# Patient Record
Sex: Male | Born: 1960 | Race: Black or African American | Hispanic: No | Marital: Single | Smoking: Current every day smoker
Health system: Southern US, Community
[De-identification: ages and names within clinical notes are randomized; demographics above are authoritative.]

## PROBLEM LIST (undated history)

## (undated) HISTORY — PX: ELBOW SURGERY: SHX618

## (undated) HISTORY — PX: OTHER SURGICAL HISTORY: SHX169

---

## 1998-07-26 ENCOUNTER — Emergency Department (HOSPITAL_COMMUNITY): Admission: EM | Admit: 1998-07-26 | Discharge: 1998-07-26 | Payer: Self-pay | Admitting: Emergency Medicine

## 1998-10-15 ENCOUNTER — Inpatient Hospital Stay (HOSPITAL_COMMUNITY): Admission: EM | Admit: 1998-10-15 | Discharge: 1998-10-20 | Payer: Self-pay | Admitting: Emergency Medicine

## 1998-10-15 ENCOUNTER — Encounter: Payer: Self-pay | Admitting: Emergency Medicine

## 1998-10-16 ENCOUNTER — Encounter: Payer: Self-pay | Admitting: Orthopedic Surgery

## 2004-05-03 ENCOUNTER — Emergency Department (HOSPITAL_COMMUNITY): Admission: EM | Admit: 2004-05-03 | Discharge: 2004-05-03 | Payer: Self-pay | Admitting: Emergency Medicine

## 2004-07-07 ENCOUNTER — Emergency Department (HOSPITAL_COMMUNITY): Admission: EM | Admit: 2004-07-07 | Discharge: 2004-07-07 | Payer: Self-pay | Admitting: Family Medicine

## 2006-10-27 ENCOUNTER — Emergency Department (HOSPITAL_COMMUNITY): Admission: EM | Admit: 2006-10-27 | Discharge: 2006-10-27 | Payer: Self-pay | Admitting: Family Medicine

## 2007-03-14 ENCOUNTER — Emergency Department (HOSPITAL_COMMUNITY): Admission: EM | Admit: 2007-03-14 | Discharge: 2007-03-14 | Payer: Self-pay | Admitting: Emergency Medicine

## 2007-07-30 ENCOUNTER — Emergency Department (HOSPITAL_COMMUNITY): Admission: EM | Admit: 2007-07-30 | Discharge: 2007-07-30 | Payer: Self-pay | Admitting: Emergency Medicine

## 2008-02-10 ENCOUNTER — Emergency Department (HOSPITAL_COMMUNITY): Admission: EM | Admit: 2008-02-10 | Discharge: 2008-02-10 | Payer: Self-pay | Admitting: Emergency Medicine

## 2008-03-04 ENCOUNTER — Emergency Department (HOSPITAL_COMMUNITY): Admission: EM | Admit: 2008-03-04 | Discharge: 2008-03-04 | Payer: Self-pay | Admitting: Emergency Medicine

## 2011-06-05 LAB — POCT URINALYSIS DIP (DEVICE)
Glucose, UA: NEGATIVE
Nitrite: NEGATIVE
Operator id: 235561
Specific Gravity, Urine: 1.02
Urobilinogen, UA: 1

## 2012-01-08 ENCOUNTER — Encounter (HOSPITAL_COMMUNITY): Payer: Self-pay | Admitting: Emergency Medicine

## 2012-01-08 ENCOUNTER — Emergency Department (HOSPITAL_COMMUNITY)
Admission: EM | Admit: 2012-01-08 | Discharge: 2012-01-08 | Disposition: A | Discharge status: Home / Self Care | Payer: Self-pay | Attending: Emergency Medicine | Admitting: Emergency Medicine

## 2012-01-08 ENCOUNTER — Emergency Department (HOSPITAL_COMMUNITY): Payer: Self-pay

## 2012-01-08 DIAGNOSIS — M25529 Pain in unspecified elbow: Secondary | ICD-10-CM | POA: Insufficient documentation

## 2012-01-08 DIAGNOSIS — M25522 Pain in left elbow: Secondary | ICD-10-CM

## 2012-01-08 MED ORDER — IBUPROFEN 200 MG PO TABS
400.0000 mg | ORAL_TABLET | Freq: Once | ORAL | Status: AC
  Administered 2012-01-08: 400 mg via ORAL
  Filled 2012-01-08: qty 2

## 2012-01-08 MED ORDER — OXYCODONE-ACETAMINOPHEN 5-325 MG PO TABS
2.0000 | ORAL_TABLET | Freq: Once | ORAL | Status: AC
  Administered 2012-01-08: 2 via ORAL
  Filled 2012-01-08: qty 2

## 2012-01-08 MED ORDER — OXYCODONE-ACETAMINOPHEN 5-325 MG PO TABS: 1.0000 | ORAL_TABLET | ORAL | Status: AC | PRN

## 2012-01-08 MED ORDER — NAPROXEN 500 MG PO TABS: 500.0000 mg | ORAL_TABLET | Freq: Two times a day (BID) | ORAL | Status: AC | PRN

## 2012-01-08 NOTE — Discharge Instructions (Signed)
 RESOURCE GUIDE  Dental Problems  Patients with Medicaid: [facility]                     [facility] [address]                                           [address] Phone:  [phone]                                                  Phone:  [phone]  If unable to pay or uninsured, contact:  [facility] or [facility] to become qualified for the adult dental clinic.  Chronic Pain Problems Contact [facility]  [phone] Patients need to be referred by their primary care doctor.  Insufficient Money for Medicine Contact [employer]:  call "211" or [facility] 271-5999.  No Primary Care Doctor Call [facility]  832-8000 Other agencies that provide inexpensive medical care    [facility]  832-8035    [facility]  [phone]    [facility]  [phone]    [facility]  832-4777    [facility]  [phone]    [facility]  [phone]  Psychological Services [facility]  [phone] [facility]  [phone] [facility]   800 853-5163 (emergency services 641-4993)  Substance Abuse Resources [facility]  336-882-2125 [facility] 336-784-9470 [facility] 336-285-9073 [facility] 336-845-3988 Residential & Outpatient Substance Abuse Program  800-659-3381  Abuse/Neglect [place] Child Abuse Hotline (336) 641-3795 [place] Child Abuse Hotline 800-378-5315 (After Hours)  Emergency Shelter [facility] (336) 271-5985  [facility] (336) 275-9566 [facility] (704) 372-4663  MRSA Hotline #:   832-7006    [place] [facility]  [facility] [place]     [employer]                          [facility] [address] [place]                       [address]      [address]  [place]                                                [place]                            [place] Phone:  [phone]                                   Phone:  [phone]                 Phone:  [phone]  [facility] Phone:    [phone]  [place] Child Abuse Hotline (336) 342-1394 (336) 342-3537 (After Hours)   

## 2012-01-08 NOTE — ED Provider Notes (Signed)
History  This chart was scribed for Raeford Razor, MD by Bennett Scrape. This patient was seen in room STRE5/STRE5 and the patient's care was started at 1:59PM.  CSN: [id number]  Arrival date & time 01/08/12  1121   First MD Initiated Contact with Patient 01/08/12 1359      Chief Complaint  Patient presents with  . Arm Pain     The history is provided by the patient. No language interpreter was used.    Jose Taylor is a 51 y.o. male who presents to the Emergency Department complaining of two weeks of gradual onset, gradually worsening, intermittent left elbow pain described as a sharp ache that he noticed while stretching.  He rates his pain an 8 out of 10 currently. He states that he is unable to pick up anything heavy. He reports that he took a tylenol for arthritis pill 2 days ago with no improvement in his symptoms. Pt reports that he has started exercise again recently as well. He reports that he broke his left elbow 2 years ago and states that he removed his cast prematurely. He is afraid that this cause some type of chronic injury. He denies numbness or tingling as associated symptoms. He has no h/o chronic medical conditions. He is an alcohol user but denies smoking.  History reviewed. No pertinent past medical history.  Past Surgical History  Procedure Date  . Elbow surgery     No family history on file.  History  Substance Use Topics  . Smoking status: Never Smoker   . Smokeless tobacco: Not on file  . Alcohol Use: Yes      Review of Systems  Constitutional: Negative for fever and chills.  Respiratory: Negative for cough and shortness of breath.   Gastrointestinal: Negative for nausea and vomiting.  Musculoskeletal: Negative for back pain.       Left elbow pain  Neurological: Negative for weakness and headaches.  All other systems reviewed and are negative.    Allergies  Penicillins  Home Medications  No current outpatient prescriptions on  file.  Triage Vitals: BP 99/67  Pulse 84  Temp(Src) 97.1 F (36.2 C) (Oral)  Resp 20  SpO2 99%  Physical Exam  Nursing note and vitals reviewed. Constitutional: He is oriented to person, place, and time. He appears well-developed and well-nourished. No distress.  HENT:  Head: Normocephalic and atraumatic.  Eyes: EOM are normal.  Neck: Neck supple. No tracheal deviation present.  Cardiovascular: Normal rate.   Pulmonary/Chest: Effort normal. No respiratory distress.  Musculoskeletal: Normal range of motion.  Neurological: He is alert and oriented to person, place, and time.  Skin: Skin is warm and dry.  Psychiatric: He has a normal mood and affect. His behavior is normal.    ED Course  Procedures (including critical care time)  DIAGNOSTIC STUDIES: Oxygen Saturation is 99% on room air, normal by my interpretation.    COORDINATION OF CARE: 2:12PM-Discussed x-rays and pain medication as treatment plan with pt and pt agreed to plan. 3:09PM-Informed pt of negative x-ray and discussed discharge plan which pt agreed to.  Labs Reviewed - No data to display Dg Elbow Complete Left  01/08/2012  *RADIOLOGY REPORT*  Clinical Data: The left elbow pain.  LEFT ELBOW - COMPLETE 3+ VIEW  Comparison: None.  Findings: No acute bony abnormality.  Specifically, no fracture, subluxation, or dislocation.  Soft tissues are intact.  No joint effusion.  IMPRESSION: No acute bony abnormality.  Original Report Authenticated By: Aubery Lapping  DOVER, M.D.     1. Elbow pain, left       MDM  51yM with L elbow pain. Remote hx of trauma but nothing acute. XR normal. Exam unremarkable. No tenderness over medial/lateral condyles. Possible strain from recently restarting to work out. Plan symptomatic tx at this time.    I personally preformed the services scribed in my presence. The recorded information has been reviewed and considered. Raeford Razor, MD.    Raeford Razor, MD 01/10/12 [phone]

## 2012-01-08 NOTE — ED Notes (Signed)
Onset two weeks ago left elbow pain intermittent recent constant states history of broke left elbow.  Patient has full range of motion pain 8/10 achy sharp states unable to pick up anything heavy.

## 2012-01-08 NOTE — ED Notes (Signed)
Pt received to RM 5 with c/o on and off lt elbow x 2-3 weeks. Pt denies any trauma. Pt is NAD

## 2013-02-21 ENCOUNTER — Encounter (HOSPITAL_COMMUNITY): Payer: Self-pay | Admitting: Emergency Medicine

## 2013-02-21 ENCOUNTER — Emergency Department (HOSPITAL_COMMUNITY)
Admission: EM | Admit: 2013-02-21 | Discharge: 2013-02-21 | Disposition: A | Discharge status: Home / Self Care | Payer: Self-pay | Attending: Emergency Medicine | Admitting: Emergency Medicine

## 2013-02-21 DIAGNOSIS — R300 Dysuria: Secondary | ICD-10-CM | POA: Insufficient documentation

## 2013-02-21 DIAGNOSIS — Z880 Allergy status to penicillin: Secondary | ICD-10-CM | POA: Insufficient documentation

## 2013-02-21 LAB — URINALYSIS, ROUTINE W REFLEX MICROSCOPIC
Leukocytes, UA: NEGATIVE
Nitrite: NEGATIVE
Protein, ur: NEGATIVE mg/dL
Urobilinogen, UA: 1 mg/dL (ref 0.0–1.0)

## 2013-02-21 NOTE — ED Provider Notes (Signed)
   History    CSN: [id number] Arrival date & time 02/21/13  4098  First MD Initiated Contact with Patient 02/21/13 [phone]     No chief complaint on file.  (Consider location/radiation/quality/duration/timing/severity/associated sxs/prior Treatment) HPI Jose Taylor is a 52 y/o male who presents with c/o tingling in his urethra. He states he is worried about an STD. The patient had unprotected oral sex approximately 2 weeks ago. He denies anal or vaginal intercourse. He states that since then he has had tingling in his penis the is intemittent, and not associated with urination. He dienis discharge, lesions, testicle pain, groin swelling. Patient denis symptoms of prostatic hypertrophy. Denies  Dysuria, hematuria, abdominal pain, flank pain, nausea,or vomiting. He denies fever, chills, Myalgias, arthralgias, or rash.  History reviewed. No pertinent past medical history. Past Surgical History  Procedure Laterality Date  . Elbow surgery    . Artificial hip     History reviewed. No pertinent family history. History  Substance Use Topics  . Smoking status: Never Smoker   . Smokeless tobacco: Not on file  . Alcohol Use: Yes    Review of Systems As stated in HPI Allergies  Penicillins  Home Medications  No current outpatient prescriptions on file. BP 113/75  Pulse 70  Temp(Src) 97.3 F (36.3 C) (Oral)  Resp 16  SpO2 100% Physical Exam Physical Exam  Nursing note and vitals reviewed. Constitutional: He appears well-developed and well-nourished. No distress.  HENT:  Head: Normocephalic and atraumatic.  Eyes: Conjunctivae normal are normal. No scleral icterus.  Neck: Normal range of motion. Neck supple.  Cardiovascular: Normal rate, regular rhythm and normal heart sounds.   Pulmonary/Chest: Effort normal and breath sounds normal. No respiratory distress.  Abdominal: Soft. There is no tenderness.  Musculoskeletal: He exhibits no edema.  Neurological: He is alert.   Skin: Skin is warm and dry. He is not diaphoretic.  Psychiatric: His behavior is normal.  Penile exam: normal without lesions or discharge, circumcised, No testicular pain or masses, no hernias, cremasteric reflex present.  ED Course  Procedures (including critical care time) Labs Reviewed - No data to display No results found. 1. Dysuria     MDM  7:30 AM BP 113/75  Pulse 70  Temp(Src) 97.3 F (36.3 C) (Oral)  Resp 16  SpO2 100%. Patient with tingling in urethra. No discharge or abnormalities on PE. Will collect urine and screen for G/C-chlamydia with urine cytology.  8:33 AM Urine negative. G/C chlamydia pending. Patient informed that he will be contacted. F/U with Health department for further testing. The patient appears reasonably screened and/or stabilized for discharge and I doubt any other medical condition or other [facility] requiring further screening, evaluation, or treatment in the ED at this time prior to discharge.   Arthor Captain, PA-C 02/21/13 [phone]

## 2013-02-21 NOTE — ED Notes (Addendum)
Pt states he might have an STD due to receiving oral sex about 2 weeks ago. Pt states he has been having this tingling sensation. No pain/discharge. Pt alert ant oriented.

## 2013-02-22 LAB — GC/CHLAMYDIA PROBE AMP: CT Probe RNA: NEGATIVE

## 2013-02-22 NOTE — ED Provider Notes (Signed)
Medical screening examination/treatment/procedure(s) were performed by non-physician practitioner and as supervising physician I was immediately available for consultation/collaboration.   Joya Gaskins, MD 02/22/13 1525

## 2013-02-25 ENCOUNTER — Telehealth (HOSPITAL_COMMUNITY): Payer: Self-pay | Admitting: Emergency Medicine

## 2013-02-25 NOTE — ED Notes (Signed)
Pt calling for STD results.  ID verified x 2.  Pt informed gonorrhea and chlamydia were both negative.

## 2015-12-25 ENCOUNTER — Emergency Department (HOSPITAL_COMMUNITY)
Admission: EM | Admit: 2015-12-25 | Discharge: 2015-12-26 | Disposition: A | Discharge status: Home / Self Care | Payer: Self-pay | Attending: Emergency Medicine | Admitting: Emergency Medicine

## 2015-12-25 DIAGNOSIS — S51812A Laceration without foreign body of left forearm, initial encounter: Secondary | ICD-10-CM | POA: Insufficient documentation

## 2015-12-25 DIAGNOSIS — Y998 Other external cause status: Secondary | ICD-10-CM | POA: Insufficient documentation

## 2015-12-25 DIAGNOSIS — F1010 Alcohol abuse, uncomplicated: Secondary | ICD-10-CM | POA: Insufficient documentation

## 2015-12-25 DIAGNOSIS — Y9289 Other specified places as the place of occurrence of the external cause: Secondary | ICD-10-CM | POA: Insufficient documentation

## 2015-12-25 DIAGNOSIS — Y9389 Activity, other specified: Secondary | ICD-10-CM | POA: Insufficient documentation

## 2015-12-25 DIAGNOSIS — Z23 Encounter for immunization: Secondary | ICD-10-CM | POA: Insufficient documentation

## 2015-12-25 DIAGNOSIS — W25XXXA Contact with sharp glass, initial encounter: Secondary | ICD-10-CM | POA: Insufficient documentation

## 2015-12-25 MED ORDER — LORAZEPAM 2 MG/ML IJ SOLN
1.0000 mg | Freq: Once | INTRAMUSCULAR | Status: AC
  Administered 2015-12-25: 1 mg via INTRAVENOUS

## 2015-12-25 MED ORDER — HYDROMORPHONE HCL 1 MG/ML IJ SOLN
1.0000 mg | Freq: Once | INTRAMUSCULAR | Status: AC
  Administered 2015-12-25: 1 mg via INTRAVENOUS

## 2015-12-25 MED ORDER — TETANUS-DIPHTH-ACELL PERTUSSIS 5-2.5-18.5 LF-MCG/0.5 IM SUSP
0.5000 mL | Freq: Once | INTRAMUSCULAR | Status: AC
  Administered 2015-12-25: 0.5 mL via INTRAMUSCULAR
  Filled 2015-12-25: qty 0.5

## 2015-12-25 MED ORDER — SODIUM CHLORIDE 0.9 % IV BOLUS (SEPSIS)
1000.0000 mL | Freq: Once | INTRAVENOUS | Status: AC
  Administered 2015-12-25: 1000 mL via INTRAVENOUS

## 2015-12-25 MED ORDER — LORAZEPAM 2 MG/ML IJ SOLN
INTRAMUSCULAR | Status: AC
  Filled 2015-12-25: qty 1
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
## 2015-12-25 NOTE — ED Notes (Signed)
Airway is patent. RN and MD at bedside

## 2015-12-26 ENCOUNTER — Encounter (HOSPITAL_COMMUNITY): Payer: Self-pay

## 2015-12-26 ENCOUNTER — Emergency Department (HOSPITAL_COMMUNITY): Payer: Self-pay

## 2015-12-26 LAB — BASIC METABOLIC PANEL
Anion gap: 15 (ref 5–15)
BUN: 12 mg/dL (ref 6–20)
CO2: 20 mmol/L — ABNORMAL LOW (ref 22–32)
CREATININE: 1.06 mg/dL (ref 0.61–1.24)
Calcium: 9.2 mg/dL (ref 8.9–10.3)
Chloride: 107 mmol/L (ref 101–111)
GFR calc Af Amer: >60 mL/min (ref >60)
Glucose, Bld: 81 mg/dL (ref 65–99)
Potassium: 4.3 mmol/L (ref 3.5–5.1)
SODIUM: 142 mmol/L (ref 135–145)

## 2015-12-26 LAB — CBC WITH DIFFERENTIAL/PLATELET
Basophils Absolute: 0 10*3/uL (ref 0.0–0.1)
Basophils Relative: 0 %
EOS PCT: 3 %
Eosinophils Absolute: 0.4 10*3/uL (ref 0.0–0.7)
HCT: 38.8 % — ABNORMAL LOW (ref 39.0–52.0)
Hemoglobin: 12.6 g/dL — ABNORMAL LOW (ref 13.0–17.0)
LYMPHS ABS: 3.2 10*3/uL (ref 0.7–4.0)
Lymphocytes Relative: 24 %
MCH: 29.4 pg (ref 26.0–34.0)
MCHC: 32.5 g/dL (ref 30.0–36.0)
MCV: 90.4 fL (ref 78.0–100.0)
MONOS PCT: 6 %
Monocytes Absolute: 0.8 10*3/uL (ref 0.1–1.0)
NEUTROS ABS: 8.9 10*3/uL — AB (ref 1.7–7.7)
Neutrophils Relative %: 67 %
PLATELETS: 215 10*3/uL (ref 150–400)
RBC: 4.29 MIL/uL (ref 4.22–5.81)
RDW: 13.3 % (ref 11.5–15.5)
WBC: 13.3 10*3/uL — ABNORMAL HIGH (ref 4.0–10.5)

## 2015-12-26 MED ORDER — LIDOCAINE-EPINEPHRINE (PF) 2 %-1:200000 IJ SOLN
10.0000 mL | Freq: Once | INTRAMUSCULAR | Status: DC
  Filled 2015-12-26: qty 20

## 2015-12-26 NOTE — ED Notes (Signed)
Ortho was called and asked to come to ED to apply volar splint to patients left wrist.

## 2015-12-26 NOTE — ED Notes (Signed)
Pt verbalized understanding of d/c instructions and has no further questions. Pt stable and NAD. Pt instructed to get stitches removed in 10 days. Pt alert and oriented x 4.

## 2015-12-26 NOTE — Discharge Instructions (Signed)
PLEASE SEE DR Orlan LeavensTMAN THIS WEEK FOR RECHECK OF YOUR WOUND YOU WILL NEED STITCHES OUT IN 10 DAYS   Laceration Care, Adult A laceration is a cut that goes through all layers of the skin. The cut also goes into the tissue that is right under the skin. Some cuts heal on their own. Others need to be closed with stitches (sutures), staples, skin adhesive strips, or wound glue. Taking care of your cut lowers your risk of infection and helps your cut to heal better. HOW TO TAKE CARE OF YOUR CUT For stitches or staples:  Keep the wound clean and dry.  If you were given a bandage (dressing), you should change it at least one time per day or as told by your doctor. You should also change it if it gets wet or dirty.  Keep the wound completely dry for the first 24 hours or as told by your doctor. After that time, you may take a shower or a bath. However, make sure that the wound is not soaked in water until after the stitches or staples have been removed.  Clean the wound one time each day or as told by your doctor:  Wash the wound with soap and water.  Rinse the wound with water until all of the soap comes off.  Pat the wound dry with a clean towel. Do not rub the wound.  After you clean the wound, put a thin layer of antibiotic ointment on it as told by your doctor. This ointment:  Helps to prevent infection.  Keeps the bandage from sticking to the wound.  Have your stitches or staples removed as told by your doctor. If your doctor used skin adhesive strips:   Keep the wound clean and dry.  If you were given a bandage, you should change it at least one time per day or as told by your doctor. You should also change it if it gets dirty or wet.  Do not get the skin adhesive strips wet. You can take a shower or a bath, but be careful to keep the wound dry.  If the wound gets wet, pat it dry with a clean towel. Do not rub the wound.  Skin adhesive strips fall off on their own. You can trim the  strips as the wound heals. Do not remove any strips that are still stuck to the wound. They will fall off after a while. If your doctor used wound glue:  Try to keep your wound dry, but you may briefly wet it in the shower or bath. Do not soak the wound in water, such as by swimming.  After you take a shower or a bath, gently pat the wound dry with a clean towel. Do not rub the wound.  Do not do any activities that will make you really sweaty until the skin glue has fallen off on its own.  Do not apply liquid, cream, or ointment medicine to your wound while the skin glue is still on.  If you were given a bandage, you should change it at least one time per day or as told by your doctor. You should also change it if it gets dirty or wet.  If a bandage is placed over the wound, do not let the tape for the bandage touch the skin glue.  Do not pick at the glue. The skin glue usually stays on for 5-10 days. Then, it falls off of the skin. General Instructions  To help prevent scarring,  make sure to cover your wound with sunscreen whenever you are outside after stitches are removed, after adhesive strips are removed, or when wound glue stays in place and the wound is healed. Make sure to wear a sunscreen of at least 30 SPF.  Take over-the-counter and prescription medicines only as told by your doctor.  If you were given antibiotic medicine or ointment, take or apply it as told by your doctor. Do not stop using the antibiotic even if your wound is getting better.  Do not scratch or pick at the wound.  Keep all follow-up visits as told by your doctor. This is important.  Check your wound every day for signs of infection. Watch for:  Redness, swelling, or pain.  Fluid, blood, or pus.  Raise (elevate) the injured area above the level of your heart while you are sitting or lying down, if possible. GET HELP IF:  You got a tetanus shot and you have any of these problems at the injection  site:  Swelling.  Very bad pain.  Redness.  Bleeding.  You have a fever.  A wound that was closed breaks open.  You notice a bad smell coming from your wound or your bandage.  You notice something coming out of the wound, such as wood or glass.  Medicine does not help your pain.  You have more redness, swelling, or pain at the site of your wound.  You have fluid, blood, or pus coming from your wound.  You notice a change in the color of your skin near your wound.  You need to change the bandage often because fluid, blood, or pus is coming from the wound.  You start to have a new rash.  You start to have numbness around the wound. GET HELP RIGHT AWAY IF:  You have very bad swelling around the wound.  Your pain suddenly gets worse and is very bad.  You notice painful lumps near the wound or on skin that is anywhere on your body.  You have a red streak going away from your wound.  The wound is on your hand or foot and you cannot move a finger or toe like you usually can.  The wound is on your hand or foot and you notice that your fingers or toes look pale or bluish.   This information is not intended to replace advice given to you by your health care provider. Make sure you discuss any questions you have with your health care provider.   Document Released: 01/31/2008 Document Revised: 12/29/2014 Document Reviewed: 08/10/2014 Elsevier Interactive Patient Education [employer]

## 2015-12-27 ENCOUNTER — Encounter (HOSPITAL_COMMUNITY): Payer: Self-pay | Admitting: Emergency Medicine

## 2016-01-08 ENCOUNTER — Encounter (HOSPITAL_COMMUNITY): Payer: Self-pay | Admitting: Emergency Medicine

## 2016-01-08 ENCOUNTER — Emergency Department (HOSPITAL_COMMUNITY)
Admission: EM | Admit: 2016-01-08 | Discharge: 2016-01-08 | Disposition: A | Discharge status: Home / Self Care | Payer: Self-pay | Attending: Emergency Medicine | Admitting: Emergency Medicine

## 2016-01-08 DIAGNOSIS — Z880 Allergy status to penicillin: Secondary | ICD-10-CM | POA: Insufficient documentation

## 2016-01-08 DIAGNOSIS — Z4802 Encounter for removal of sutures: Secondary | ICD-10-CM | POA: Insufficient documentation

## 2016-01-08 DIAGNOSIS — Z9889 Other specified postprocedural states: Secondary | ICD-10-CM | POA: Insufficient documentation

## 2016-01-08 DIAGNOSIS — F17210 Nicotine dependence, cigarettes, uncomplicated: Secondary | ICD-10-CM | POA: Insufficient documentation

## 2016-01-08 NOTE — ED Provider Notes (Signed)
CSN: [id number]     Arrival date & time 01/08/16  7829 History  By signing my name below, I, Tanda Rockers, attest that this documentation has been prepared under the direction and in the presence of Everlene Farrier, PA-C. Electronically Signed: Tanda Rockers, ED Scribe. 01/08/2016. 9:51 AM.   Chief Complaint  Patient presents with  . Suture / Staple Removal   The history is provided by the patient. No language interpreter was used.    HPI Comments: Jose Taylor is a 55 y.o. male who presents to the Emergency Department for suture removal. Pt was seen in the ED on 12/25/2015 (approximately 2 weeks ago) for a laceration to the medial left wrist. He had 5 sutures and 3 staples placed as well as a splint. X-ray showed no fracture. Pt was told to follow up with a hand specialist for possible partial tendon injury that was not identified at time of being seen. Pt states that he did not follow up with hand and was stuck in [place], [place] this week so he was unable to come to the ED to have his sutures removed at an earlier time. He mentions that his splint caught onto a door knob yesterday, causing some irritation to the area. He had not removed the splint until last night. Denies weakness, numbness, tingling, drainage, fever, chills, or any other associated symptoms.   History reviewed. No pertinent past medical history. Past Surgical History  Procedure Laterality Date  . Elbow surgery    . Artificial hip     No family history on file. Social History  Substance Use Topics  . Smoking status: Current Every Day Smoker -- 0.20 packs/day    Types: Cigarettes  . Smokeless tobacco: None  . Alcohol Use: Yes     Comment: 3 beers per day    Review of Systems  Constitutional: Negative for fever and chills.  Skin:       Negative for drainage  Neurological: Negative for weakness and numbness.   Allergies  Penicillins and Penicillins  Home Medications   Prior to Admission medications    Not on File   BP 137/89 mmHg  Pulse 108  Temp(Src) 98.2 F (36.8 C)  Resp 20  SpO2 98%   Physical Exam  Constitutional: He appears well-developed and well-nourished. No distress.  Nontoxic appearing.  HENT:  Head: Normocephalic and atraumatic.  Eyes: Right eye exhibits no discharge. Left eye exhibits no discharge.  Cardiovascular: Normal rate, regular rhythm and intact distal pulses.   Bilateral radial pulses are intact. Good capillary refill to his left distal fingertips.  Pulmonary/Chest: Effort normal. No respiratory distress.  Musculoskeletal: He exhibits no edema or tenderness.  No discharge, erythema, edema, or tenderness around wound. Good strength to left hand at each digit.  Sutures and staples in place. No evidence of infection.   Neurological: He is alert. Coordination normal.  Skin: Skin is warm and dry. No rash noted. He is not diaphoretic. No erythema. No pallor.  Psychiatric: He has a normal mood and affect. His behavior is normal.  Nursing note and vitals reviewed.   ED Course  .Suture Removal Date/Time: 01/08/2016 9:55 AM Performed by: Everlene Farrier Authorized by: Everlene Farrier Consent: Verbal consent obtained. Risks and benefits: risks, benefits and alternatives were discussed Consent given by: patient Patient understanding: patient states understanding of the procedure being performed Patient consent: the patient's understanding of the procedure matches consent given Procedure consent: procedure consent matches procedure scheduled Relevant documents: relevant documents present and  verified Test results: test results available and properly labeled Site marked: the operative site was marked Required items: required blood products, implants, devices, and special equipment available Patient identity confirmed: verbally with patient Time out: Immediately prior to procedure a "time out" was called to verify the correct patient, procedure, equipment,  support staff and site/side marked as required. Body area: upper extremity Location details: left wrist Wound Appearance: clean Sutures Removed: 5 Staples Removed: 3 Post-removal: dressing applied and antibiotic ointment applied Facility: sutures placed in this facility Patient tolerance: Patient tolerated the procedure well with no immediate complications   (including critical care time)   Patient presents for suture and staple removal. The wound is well healed without signs of infection.  3 staples and 5 sutures were removed. Wound care and activity instructions given. Return prn.   DIAGNOSTIC STUDIES: Oxygen Saturation is 98% on RA, normal by my interpretation.    COORDINATION OF CARE: 9:47 AM-Discussed treatment plan which includes suture removal with pt at bedside and pt agreed to plan.    MDM   Meds given in ED:  Medications - No data to display  New Prescriptions   No medications on file    Final diagnoses:  Encounter for removal of sutures   Staple removal   Pt to ER for staple/suture removal and wound check as above. Procedure tolerated well. Vitals normal, no signs of infection. Scar minimization & return precautions given at discharge. I advised he can still follow up with hand as he was directed at his first visit. No Evidence for complaint of weakness from his left hand. No signs of infection. I advised the patient to follow-up with their primary care provider this week. I advised the patient to return to the emergency department with new or worsening symptoms or new concerns. The patient verbalized understanding and agreement with plan.    I personally performed the services described in this documentation, which was scribed in my presence. The recorded information has been reviewed and is accurate.         Everlene FarrierWilliam Merced Hanners, PA-C 01/08/16 1001  Loren Raceravid Yelverton, MD 01/08/16 1224

## 2016-01-08 NOTE — ED Notes (Signed)
Pt was seen here 4/29- stapes to left wrist. States he got the wound "caught on a door" last pm. Now has pain to wound,.

## 2016-01-08 NOTE — Discharge Instructions (Signed)

## 2016-02-18 ENCOUNTER — Encounter (HOSPITAL_COMMUNITY): Payer: Self-pay | Admitting: Emergency Medicine

## 2016-02-18 ENCOUNTER — Emergency Department (HOSPITAL_COMMUNITY): Payer: Self-pay

## 2016-02-18 ENCOUNTER — Emergency Department (HOSPITAL_COMMUNITY)
Admission: EM | Admit: 2016-02-18 | Discharge: 2016-02-18 | Disposition: A | Discharge status: Home / Self Care | Payer: Self-pay | Attending: Emergency Medicine | Admitting: Emergency Medicine

## 2016-02-18 DIAGNOSIS — F1290 Cannabis use, unspecified, uncomplicated: Secondary | ICD-10-CM | POA: Insufficient documentation

## 2016-02-18 DIAGNOSIS — F17210 Nicotine dependence, cigarettes, uncomplicated: Secondary | ICD-10-CM | POA: Insufficient documentation

## 2016-02-18 DIAGNOSIS — R197 Diarrhea, unspecified: Secondary | ICD-10-CM | POA: Insufficient documentation

## 2016-02-18 LAB — URINALYSIS, ROUTINE W REFLEX MICROSCOPIC
Bilirubin Urine: NEGATIVE
GLUCOSE, UA: NEGATIVE mg/dL
Hgb urine dipstick: NEGATIVE
Ketones, ur: NEGATIVE mg/dL
LEUKOCYTES UA: NEGATIVE
Nitrite: NEGATIVE
PROTEIN: NEGATIVE mg/dL
SPECIFIC GRAVITY, URINE: 1.029 (ref 1.005–1.030)
pH: 5.5 (ref 5.0–8.0)

## 2016-02-18 LAB — RAPID URINE DRUG SCREEN, HOSP PERFORMED
AMPHETAMINES: NOT DETECTED
BENZODIAZEPINES: NOT DETECTED
Barbiturates: NOT DETECTED
Cocaine: POSITIVE — AB
OPIATES: NOT DETECTED
TETRAHYDROCANNABINOL: NOT DETECTED

## 2016-02-18 LAB — COMPREHENSIVE METABOLIC PANEL
ALT: 18 U/L (ref 17–63)
ANION GAP: 8 (ref 5–15)
AST: 24 U/L (ref 15–41)
Albumin: 4.9 g/dL (ref 3.5–5.0)
Alkaline Phosphatase: 68 U/L (ref 38–126)
BUN: 15 mg/dL (ref 6–20)
CHLORIDE: 105 mmol/L (ref 101–111)
CO2: 24 mmol/L (ref 22–32)
CREATININE: 1.01 mg/dL (ref 0.61–1.24)
Calcium: 9.2 mg/dL (ref 8.9–10.3)
Glucose, Bld: 95 mg/dL (ref 65–99)
Potassium: 4 mmol/L (ref 3.5–5.1)
SODIUM: 137 mmol/L (ref 135–145)
Total Bilirubin: 1.1 mg/dL (ref 0.3–1.2)
Total Protein: 8.9 g/dL — ABNORMAL HIGH (ref 6.5–8.1)

## 2016-02-18 LAB — ETHANOL: Alcohol, Ethyl (B): <5 mg/dL (ref <5)

## 2016-02-18 LAB — CBC
HCT: 44 % (ref 39.0–52.0)
HEMOGLOBIN: 15.5 g/dL (ref 13.0–17.0)
MCH: 30.9 pg (ref 26.0–34.0)
MCHC: 35.2 g/dL (ref 30.0–36.0)
MCV: 87.6 fL (ref 78.0–100.0)
PLATELETS: 249 10*3/uL (ref 150–400)
RBC: 5.02 MIL/uL (ref 4.22–5.81)
RDW: 13.5 % (ref 11.5–15.5)
WBC: 9.3 10*3/uL (ref 4.0–10.5)

## 2016-02-18 LAB — LIPASE, BLOOD: LIPASE: 26 U/L (ref 11–51)

## 2016-02-18 MED ORDER — HYDROMORPHONE HCL 1 MG/ML IJ SOLN
0.5000 mg | Freq: Once | INTRAMUSCULAR | Status: AC
  Administered 2016-02-18: 0.5 mg via INTRAVENOUS
  Filled 2016-02-18: qty 1

## 2016-02-18 MED ORDER — ONDANSETRON HCL 4 MG/2ML IJ SOLN
4.0000 mg | Freq: Once | INTRAMUSCULAR | Status: AC
  Administered 2016-02-18: 4 mg via INTRAVENOUS
  Filled 2016-02-18: qty 2

## 2016-02-18 MED ORDER — IOPAMIDOL (ISOVUE-300) INJECTION 61%
100.0000 mL | Freq: Once | INTRAVENOUS | Status: AC | PRN
  Administered 2016-02-18: 100 mL via INTRAVENOUS

## 2016-02-18 MED ORDER — SODIUM CHLORIDE 0.9 % IV BOLUS (SEPSIS)
1000.0000 mL | Freq: Once | INTRAVENOUS | Status: AC
  Administered 2016-02-18: 1000 mL via INTRAVENOUS

## 2016-02-18 MED ORDER — DICYCLOMINE HCL 10 MG/ML IM SOLN
20.0000 mg | Freq: Once | INTRAMUSCULAR | Status: AC
  Administered 2016-02-18: 20 mg via INTRAMUSCULAR
  Filled 2016-02-18: qty 2

## 2016-02-18 MED ORDER — DIPHENOXYLATE-ATROPINE 2.5-0.025 MG PO TABS: 1.0000 | ORAL_TABLET | Freq: Four times a day (QID) | ORAL | Status: DC | PRN

## 2016-02-18 MED ORDER — DIATRIZOATE MEGLUMINE & SODIUM 66-10 % PO SOLN
15.0000 mL | Freq: Once | ORAL | Status: AC
  Administered 2016-02-18: 15 mL via ORAL

## 2016-02-18 NOTE — ED Notes (Signed)
Lower abdominal pain starting at 10:00 pm last night. Diarrhea present throughout night, denies nausea/vomiting/urinary difficulty. Crying in triage.

## 2016-02-18 NOTE — ED Notes (Signed)
Urinal at bedside.  

## 2016-02-18 NOTE — Discharge Instructions (Signed)
Take lomotil as prescribed as needed for diarrhea and to help with cramping. Take tylenol for pain. Follow up with primary care doctor.    Viral Gastroenteritis Viral gastroenteritis is also known as stomach flu. This condition affects the stomach and intestinal tract. It can cause sudden diarrhea and vomiting. The illness typically lasts 3 to 8 days. Most people develop an immune response that eventually gets rid of the virus. While this natural response develops, the virus can make you quite ill. CAUSES  Many different viruses can cause gastroenteritis, such as rotavirus or noroviruses. You can catch one of these viruses by consuming contaminated food or water. You may also catch a virus by sharing utensils or other personal items with an infected person or by touching a contaminated surface. SYMPTOMS  The most common symptoms are diarrhea and vomiting. These problems can cause a severe loss of body fluids (dehydration) and a body salt (electrolyte) imbalance. Other symptoms may include:  Fever.  Headache.  Fatigue.  Abdominal pain. DIAGNOSIS  Your caregiver can usually diagnose viral gastroenteritis based on your symptoms and a physical exam. A stool sample may also be taken to test for the presence of viruses or other infections. TREATMENT  This illness typically goes away on its own. Treatments are aimed at rehydration. The most serious cases of viral gastroenteritis involve vomiting so severely that you are not able to keep fluids down. In these cases, fluids must be given through an intravenous line (IV). HOME CARE INSTRUCTIONS   Drink enough fluids to keep your urine clear or pale yellow. Drink small amounts of fluids frequently and increase the amounts as tolerated.  Ask your caregiver for specific rehydration instructions.  Avoid:  Foods high in sugar.  Alcohol.  Carbonated drinks.  Tobacco.  Juice.  Caffeine drinks.  Extremely hot or cold fluids.  Fatty, greasy  foods.  Too much intake of anything at one time.  Dairy products until 24 to 48 hours after diarrhea stops.  You may consume probiotics. Probiotics are active cultures of beneficial bacteria. They may lessen the amount and number of diarrheal stools in adults. Probiotics can be found in yogurt with active cultures and in supplements.  Wash your hands well to avoid spreading the virus.  Only take over-the-counter or prescription medicines for pain, discomfort, or fever as directed by your caregiver. Do not give aspirin to children. Antidiarrheal medicines are not recommended.  Ask your caregiver if you should continue to take your regular prescribed and over-the-counter medicines.  Keep all follow-up appointments as directed by your caregiver. SEEK IMMEDIATE MEDICAL CARE IF:   You are unable to keep fluids down.  You do not urinate at least once every 6 to 8 hours.  You develop shortness of breath.  You notice blood in your stool or vomit. This may look like coffee grounds.  You have abdominal pain that increases or is concentrated in one small area (localized).  You have persistent vomiting or diarrhea.  You have a fever.  The patient is a child younger than 3 months, and he or she has a fever.  The patient is a child older than 3 months, and he or she has a fever and persistent symptoms.  The patient is a child older than 3 months, and he or she has a fever and symptoms suddenly get worse.  The patient is a baby, and he or she has no tears when crying. MAKE SURE YOU:   Understand these instructions.  Will watch  your condition.  Will get help right away if you are not doing well or get worse.   This information is not intended to replace advice given to you by your health care provider. Make sure you discuss any questions you have with your health care provider.   Document Released: 08/14/2005 Document Revised: 11/06/2011 Document Reviewed: 05/31/2011 Elsevier  Interactive Patient Education [employer].

## 2016-02-18 NOTE — ED Notes (Signed)
Patient transported to CT 

## 2016-02-18 NOTE — ED Provider Notes (Signed)
CSN: [id number]     Arrival date & time 02/18/16  0803 History   First MD Initiated Contact with Patient 02/18/16 0827     Chief Complaint  Patient presents with  . Abdominal Pain     (Consider location/radiation/quality/duration/timing/severity/associated sxs/prior Treatment) HPI Collier FlowersChristopher Haberland is a 55 y.o. male with complaints of abdominal pain that started at 10 PM last night. Patient states pain started suddenly. Patient is poor historian because he is crying. He states pain is all over. He reports approximately 5 episodes of diarrhea. He denies nausea or vomiting. Denies any blood in his stool. States bowel movements were watery. She took Pepto-Bismol to help with his symptoms but did not help. He denies any prior similar symptoms. He reports history of pelvic fracture in the past with surgical fixation, states many years ago after a trauma. He denies any urinary symptoms. No other complaints.  History reviewed. No pertinent past medical history. Past Surgical History  Procedure Laterality Date  . Elbow surgery    . Artificial hip     History reviewed. No pertinent family history. Social History  Substance Use Topics  . Smoking status: Current Every Day Smoker -- 0.20 packs/day    Types: Cigarettes  . Smokeless tobacco: None  . Alcohol Use: Yes     Comment: 3 beers per day    Review of Systems  Constitutional: Negative for fever and chills.  Respiratory: Negative for cough, chest tightness and shortness of breath.   Cardiovascular: Negative for chest pain, palpitations and leg swelling.  Gastrointestinal: Positive for abdominal pain and diarrhea. Negative for nausea, vomiting, blood in stool and abdominal distention.  Genitourinary: Negative for dysuria, urgency, frequency and hematuria.  Musculoskeletal: Negative for myalgias, arthralgias, neck pain and neck stiffness.  Skin: Negative for rash.  Allergic/Immunologic: Negative for immunocompromised state.   Neurological: Negative for dizziness, weakness, light-headedness, numbness and headaches.  All other systems reviewed and are negative.     Allergies  Penicillins and Penicillins  Home Medications   Prior to Admission medications   Not on File   BP 129/79 mmHg  Pulse 88  Temp(Src) 98.9 F (37.2 C) (Oral)  Resp 14  SpO2 95% Physical Exam  Constitutional: He appears well-developed and well-nourished. No distress.  HENT:  Head: Normocephalic and atraumatic.  Eyes: Conjunctivae are normal.  Neck: Neck supple.  Cardiovascular: Normal rate, regular rhythm and normal heart sounds.   Pulmonary/Chest: Effort normal. No respiratory distress. He has no wheezes. He has no rales.  Abdominal: Soft. Bowel sounds are normal. He exhibits no distension. There is tenderness. There is guarding.  Diffuse tenderness  Musculoskeletal: He exhibits no edema.  Neurological: He is alert.  Skin: Skin is warm and dry.  Nursing note and vitals reviewed.   ED Course  Procedures (including critical care time) Labs Review Labs Reviewed  COMPREHENSIVE METABOLIC PANEL - Abnormal; Notable for the following:    Total Protein 8.9 (*)    All other components within normal limits  URINE RAPID DRUG SCREEN, HOSP PERFORMED - Abnormal; Notable for the following:    Cocaine POSITIVE (*)    All other components within normal limits  LIPASE, BLOOD  CBC  URINALYSIS, ROUTINE W REFLEX MICROSCOPIC (NOT [facility])  ETHANOL    Imaging Review Ct Abdomen Pelvis W Contrast  02/18/2016  CLINICAL DATA:  Lower abdominal pain since last evening. Diarrhea throughout the night. EXAM: CT ABDOMEN AND PELVIS WITH CONTRAST TECHNIQUE: Multidetector CT imaging of the abdomen and pelvis was  performed using the standard protocol following bolus administration of intravenous contrast. CONTRAST:  100mL ISOVUE-300 IOPAMIDOL (ISOVUE-300) INJECTION 61% COMPARISON:  None. FINDINGS: Lower chest: Bibasilar subsegmental atelectasis. Heart  size accentuated by a mild pectus excavatum deformity. No pericardial or pleural effusion. Hepatobiliary: Too small to characterize right liver lobe lesion. Normal gallbladder, without biliary ductal dilatation. Pancreas: Normal, without mass or ductal dilatation. Spleen: Normal in size, without focal abnormality. Adrenals/Urinary Tract: Normal adrenal glands. Normal kidneys, without hydronephrosis. Normal urinary bladder. Degraded evaluation of the pelvis, secondary to beam hardening artifact from left pelvic fixation. Stomach/Bowel: Normal stomach, without wall thickening. Fluid-filled colon, consistent with a diarrheal state. Normal terminal ileum and appendix. Normal small bowel. Vascular/Lymphatic: Aortic and branch vessel atherosclerosis. No abdominopelvic adenopathy. Reproductive: Normal prostate. Other: No significant free fluid. Musculoskeletal: No acute osseous abnormality. Degenerative disc disease at the lumbosacral junction. IMPRESSION: 1.  No acute process in the abdomen or pelvis. 2. Fluid-filled colon, consistent with the clinical history of diarrhea. 3.  Aortic atherosclerosis. Electronically Signed   By: Jeronimo GreavesKyle  Talbot M.D.   On: 02/18/2016 11:42   I have personally reviewed and evaluated these images and lab results as part of my medical decision-making.   EKG Interpretation None      MDM   Final diagnoses:  Diarrhea, unspecified type   Patient with acute onset of diffuse abdominal pain a 10 PM last night. Patient is hemodynamically stable, his vital signs are all within normal. It is a poor historian this time, he is rolling around in bed, crying and screaming in pain. I was able to examine his abdomen, he has diffuse tenderness with guarding all over the abdomen. His bowel sounds are present however and there is no distention. We will get labs and CT abdomen and pelvis for further evaluation. Order Dilaudid and Zofran for his symptoms.  Patient feels much better, and pain-free. His  CT scan is negative. Labs unremarkable. Urine rapid drug screen is positive for cocaine. Suspect might be possible abdominal cramps. Will treat with Bentyl, follow-up with primary doctor as needed. Zofran for nausea. Return precautions discussed  Filed Vitals:   02/18/16 0813 02/18/16 1023 02/18/16 1242  BP: 129/79 119/88 101/59  Pulse: 88 77 77  Temp: 98.9 F (37.2 C) 97.6 F (36.4 C) 99 F (37.2 C)  TempSrc: Oral Oral Oral  Resp: 14 16 16   SpO2: 95% 99% 96%     Jaynie Crumbleatyana Paislee Szatkowski, PA-C 02/18/16 1557  Rolland PorterMark James, MD 02/26/16 2245

## 2016-02-18 NOTE — ED Notes (Signed)
Patient resting comfortably, states he feels better.

## 2016-08-30 ENCOUNTER — Encounter (HOSPITAL_COMMUNITY): Payer: Self-pay | Admitting: Emergency Medicine

## 2016-08-30 ENCOUNTER — Emergency Department (HOSPITAL_COMMUNITY)
Admission: EM | Admit: 2016-08-30 | Discharge: 2016-08-30 | Disposition: A | Discharge status: Home / Self Care | Payer: Self-pay | Attending: Emergency Medicine | Admitting: Emergency Medicine

## 2016-08-30 ENCOUNTER — Emergency Department (HOSPITAL_COMMUNITY): Payer: Self-pay

## 2016-08-30 DIAGNOSIS — Y939 Activity, unspecified: Secondary | ICD-10-CM | POA: Insufficient documentation

## 2016-08-30 DIAGNOSIS — F17210 Nicotine dependence, cigarettes, uncomplicated: Secondary | ICD-10-CM | POA: Insufficient documentation

## 2016-08-30 DIAGNOSIS — X501XXA Overexertion from prolonged static or awkward postures, initial encounter: Secondary | ICD-10-CM | POA: Insufficient documentation

## 2016-08-30 DIAGNOSIS — M5442 Lumbago with sciatica, left side: Secondary | ICD-10-CM | POA: Insufficient documentation

## 2016-08-30 DIAGNOSIS — Y929 Unspecified place or not applicable: Secondary | ICD-10-CM | POA: Insufficient documentation

## 2016-08-30 DIAGNOSIS — M545 Low back pain, unspecified: Secondary | ICD-10-CM

## 2016-08-30 DIAGNOSIS — Y999 Unspecified external cause status: Secondary | ICD-10-CM | POA: Insufficient documentation

## 2016-08-30 DIAGNOSIS — M5441 Lumbago with sciatica, right side: Secondary | ICD-10-CM | POA: Insufficient documentation

## 2016-08-30 LAB — CBC WITH DIFFERENTIAL/PLATELET
BASOS PCT: 0 %
Basophils Absolute: 0 10*3/uL (ref 0.0–0.1)
EOS ABS: 0.3 10*3/uL (ref 0.0–0.7)
EOS PCT: 3 %
HEMATOCRIT: 40.6 % (ref 39.0–52.0)
HEMOGLOBIN: 13.3 g/dL (ref 13.0–17.0)
LYMPHS PCT: 21 %
Lymphs Abs: 2.2 10*3/uL (ref 0.7–4.0)
MCH: 29.6 pg (ref 26.0–34.0)
MCHC: 32.8 g/dL (ref 30.0–36.0)
MCV: 90.4 fL (ref 78.0–100.0)
MONOS PCT: 6 %
Monocytes Absolute: 0.6 10*3/uL (ref 0.1–1.0)
NEUTROS PCT: 70 %
Neutro Abs: 7.3 10*3/uL (ref 1.7–7.7)
Platelets: 224 10*3/uL (ref 150–400)
RBC: 4.49 MIL/uL (ref 4.22–5.81)
RDW: 13 % (ref 11.5–15.5)
WBC: 10.4 10*3/uL (ref 4.0–10.5)

## 2016-08-30 LAB — BASIC METABOLIC PANEL
ANION GAP: 6 (ref 5–15)
BUN: 9 mg/dL (ref 6–20)
CHLORIDE: 106 mmol/L (ref 101–111)
CO2: 28 mmol/L (ref 22–32)
Calcium: 9.2 mg/dL (ref 8.9–10.3)
Creatinine, Ser: 0.92 mg/dL (ref 0.61–1.24)
GFR calc non Af Amer: >60 mL/min (ref >60)
GLUCOSE: 112 mg/dL — AB (ref 65–99)
Potassium: 4.5 mmol/L (ref 3.5–5.1)
Sodium: 140 mmol/L (ref 135–145)

## 2016-08-30 MED ORDER — HYDROCODONE-ACETAMINOPHEN 5-325 MG PO TABS: ORAL_TABLET | ORAL | 0 refills | Status: DC

## 2016-08-30 MED ORDER — MORPHINE SULFATE (PF) 4 MG/ML IV SOLN
4.0000 mg | Freq: Once | INTRAVENOUS | Status: AC
  Administered 2016-08-30: 4 mg via INTRAMUSCULAR
  Filled 2016-08-30: qty 1

## 2016-08-30 MED ORDER — METHOCARBAMOL 500 MG PO TABS: ORAL_TABLET | ORAL | 0 refills | Status: DC

## 2016-08-30 MED ORDER — LORAZEPAM 2 MG/ML IJ SOLN
1.0000 mg | Freq: Once | INTRAMUSCULAR | Status: DC
  Filled 2016-08-30: qty 1

## 2016-08-30 MED ORDER — HYDROMORPHONE HCL 1 MG/ML IJ SOLN
0.5000 mg | Freq: Once | INTRAMUSCULAR | Status: AC
Start: 2016-08-30 — End: 2016-08-30
  Administered 2016-08-30: 0.5 mg via INTRAVENOUS
  Filled 2016-08-30: qty 1

## 2016-08-30 MED ORDER — LIDOCAINE 5 % EX OINT
TOPICAL_OINTMENT | Freq: Once | CUTANEOUS | Status: AC
  Administered 2016-08-30: 1 via TOPICAL
  Filled 2016-08-30: qty 35.44

## 2016-08-30 MED ORDER — LIDOCAINE 4 % EX CREA
TOPICAL_CREAM | Freq: Once | CUTANEOUS | Status: DC
  Filled 2016-08-30: qty 5

## 2016-08-30 MED ORDER — LORAZEPAM 2 MG/ML IJ SOLN
1.0000 mg | Freq: Once | INTRAMUSCULAR | Status: AC
  Administered 2016-08-30: 1 mg via INTRAMUSCULAR

## 2016-08-30 MED ORDER — METHOCARBAMOL 500 MG PO TABS
1000.0000 mg | ORAL_TABLET | Freq: Once | ORAL | Status: AC
  Administered 2016-08-30: 1000 mg via ORAL
  Filled 2016-08-30: qty 2

## 2016-08-30 MED ORDER — GADOBENATE DIMEGLUMINE 529 MG/ML IV SOLN
15.0000 mL | Freq: Once | INTRAVENOUS | Status: AC | PRN
  Administered 2016-08-30: 14 mL via INTRAVENOUS
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
## 2016-08-30 NOTE — ED Notes (Signed)
Bed: WA19 Expected date:  Expected time:  Means of arrival:  Comments: EMS/back pain 

## 2016-08-30 NOTE — ED Triage Notes (Signed)
Per EMS, states left lower back radiating into left hip and buttocks-states he bent over to pick something up and heard something pop-crawled to phone and called 911-no deformities noted-same symptoms occurred over holidays-resolved on its own

## 2016-08-30 NOTE — Discharge Instructions (Signed)
Please take ibuprofen 400mg  (this is normally 2 over the counter pills) every 6 hours (take with food to minimze stomach irritation).   Take robaxin and/or Vicodin for breakthrough pain, do not drink alcohol, drive, care for children or perfom other critical tasks while taking robaxin and/or Vicodin .  Do not hesitate to return to the emergency room for any new, worsening or concerning symptoms.  Please obtain primary care using resource guide below. Let them know that you were seen in the emergency room and that they will need to obtain records for further outpatient management.

## 2017-05-11 IMAGING — CT CT ABD-PELV W/ CM
2 of 5 series · 16 of 46 positions shown, 18 images · IV contrast (ISOVUE)
Comparison: None.

CLINICAL DATA: Lower abdominal pain since last evening. Diarrhea
throughout the night.

EXAM:
CT ABDOMEN AND PELVIS WITH CONTRAST
TECHNIQUE: Multidetector CT imaging of the abdomen and pelvis was performed
using the standard protocol following bolus administration of
intravenous contrast.
CONTRAST:  100mL FL5N1D-9LL IOPAMIDOL (FL5N1D-9LL) INJECTION 61%

[Series 2: abd/pel with · axial · 0.71mm/px · z∈[+938,+1328]mm · 13 of 91 slices shown, 15 images]
[im 7/91  soft-tissue]
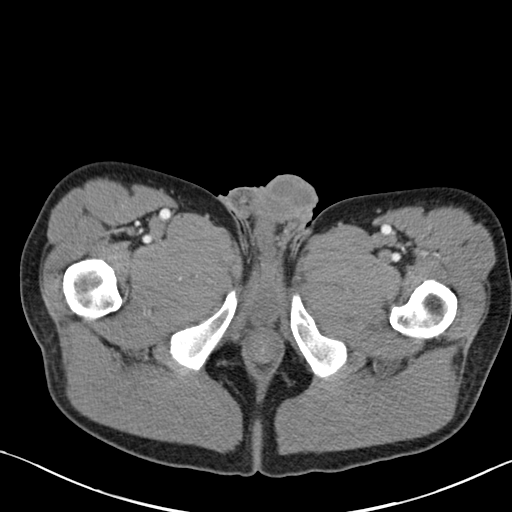
[im 7/91  bone]
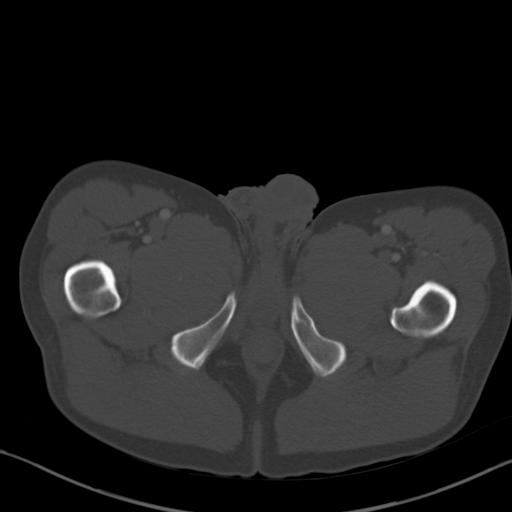
[im 13/91  soft-tissue]
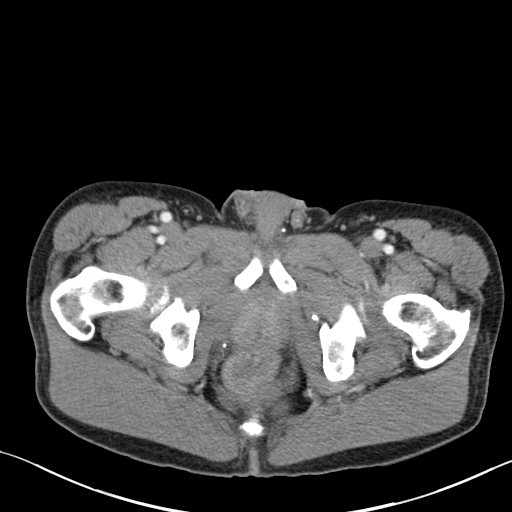
[im 19/91  soft-tissue]
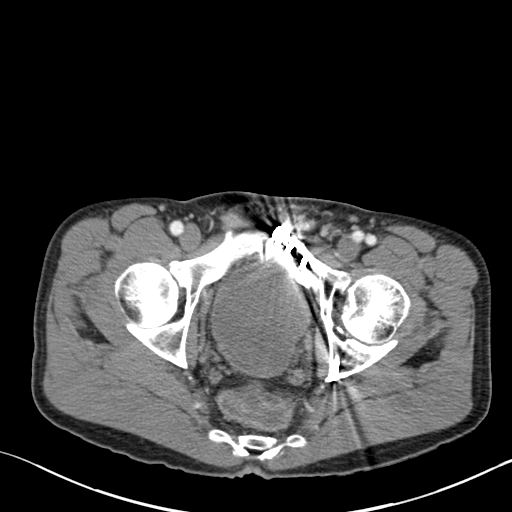
[im 25/91  soft-tissue]
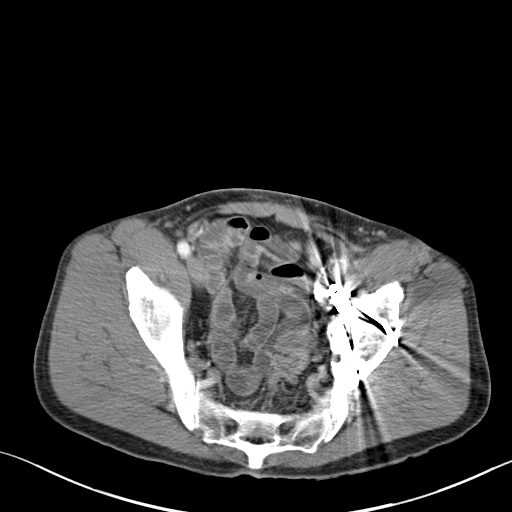
[im 31/91  soft-tissue]
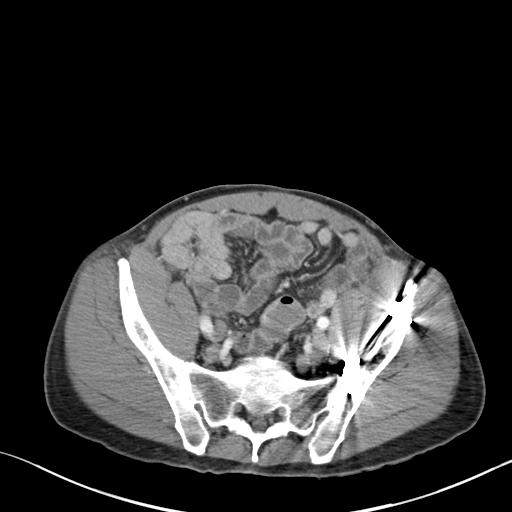
[im 37/91  soft-tissue]
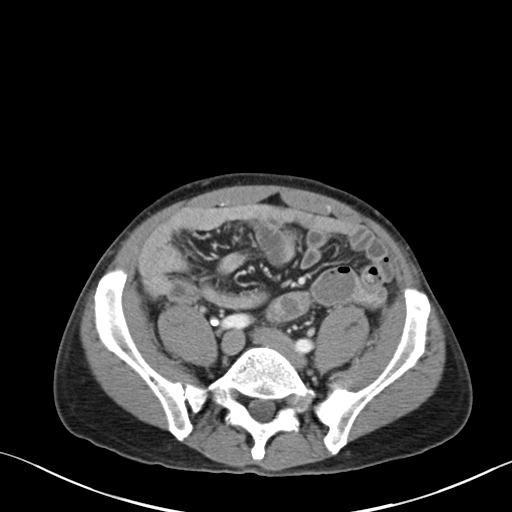
[im 49/91  soft-tissue]
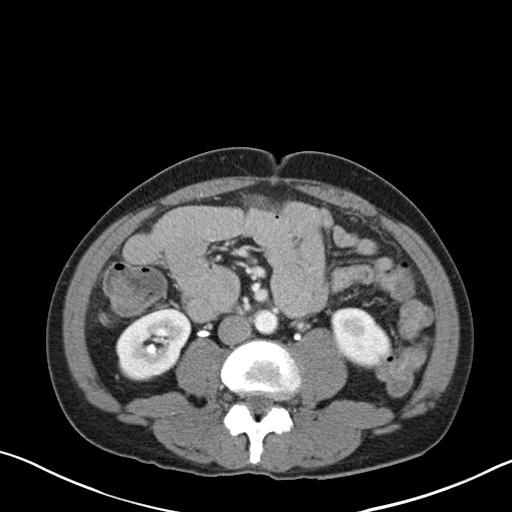
[im 55/91  soft-tissue]
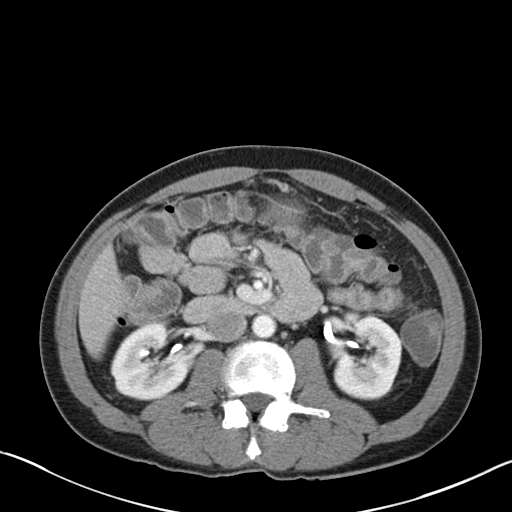
[im 61/91  soft-tissue]
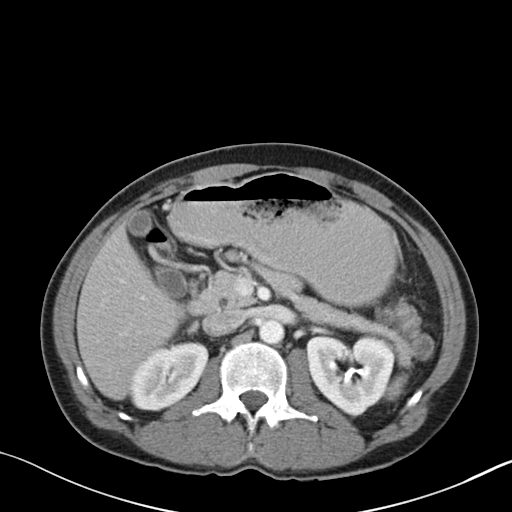
[im 61/91  bone]
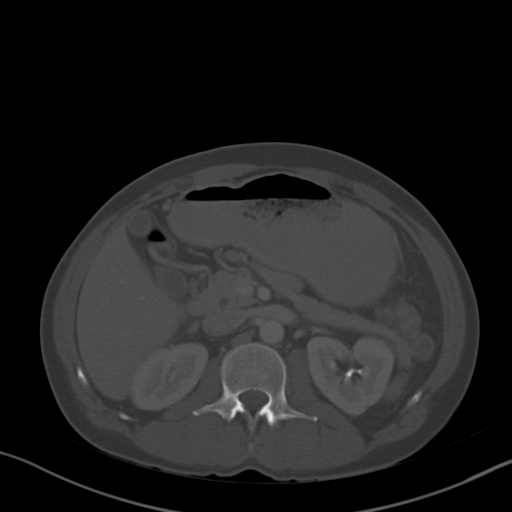
[im 67/91  soft-tissue]
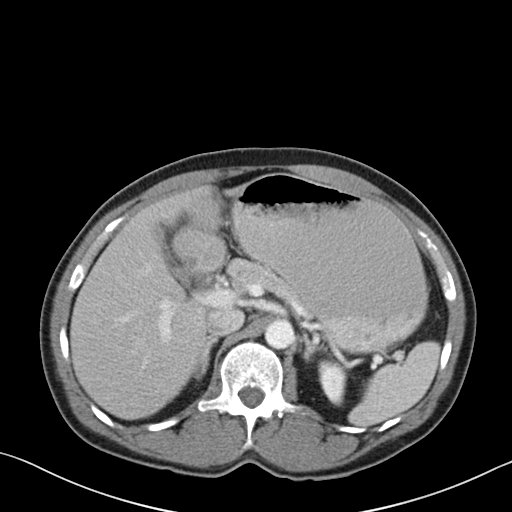
[im 73/91  soft-tissue]
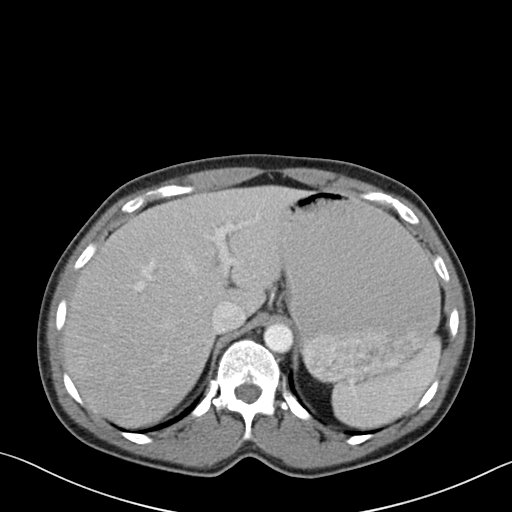
[im 79/91  soft-tissue]
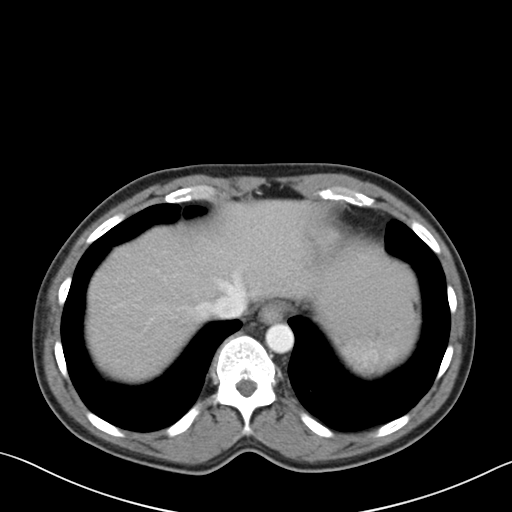
[im 85/91  soft-tissue]
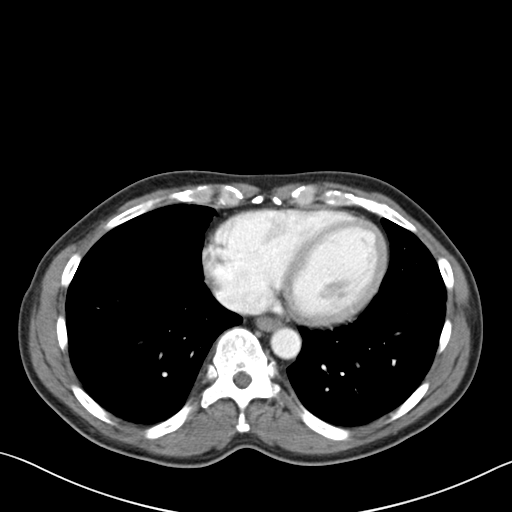

[Series 3: coronal a/|p · coronal · 0.64mm/px · 3 of 122 slices shown]
[im 41/122  soft-tissue]
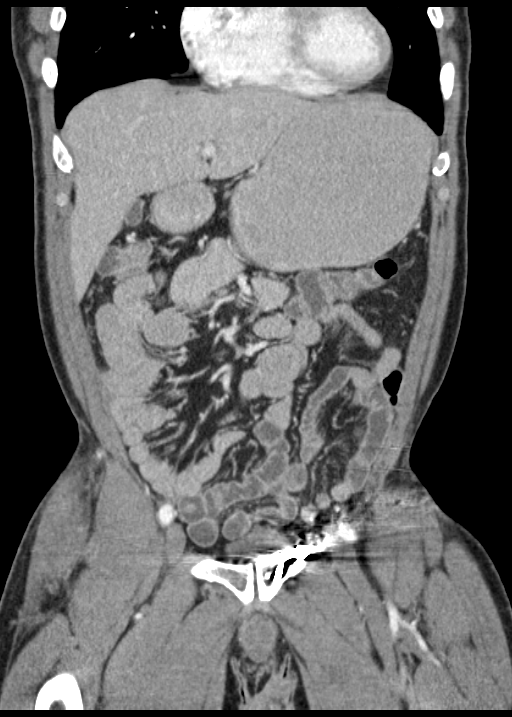
[im 54/122  soft-tissue]
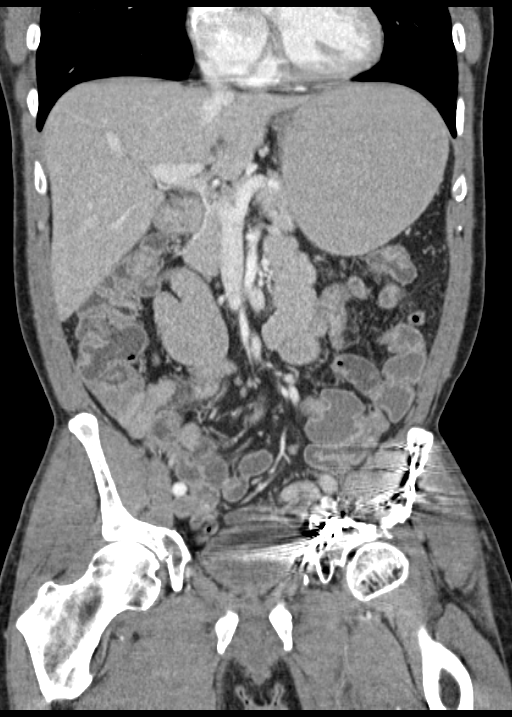
[im 68/122  soft-tissue]
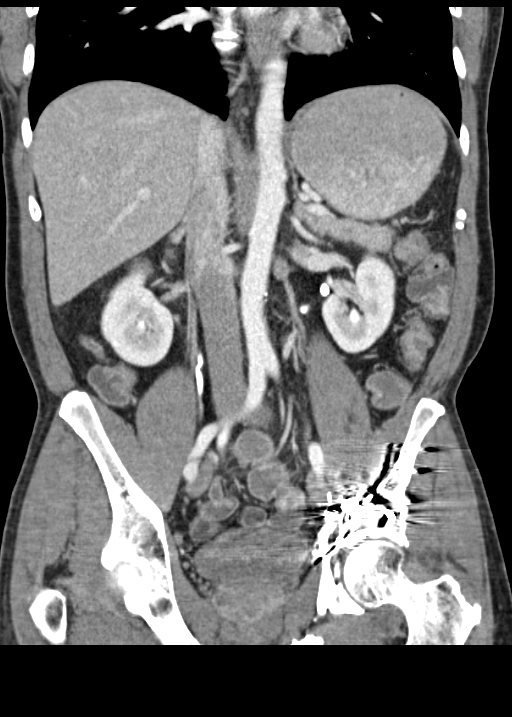

[16 of 46 positions shown; findings below may reference images not displayed]

FINDINGS: Lower chest: Bibasilar subsegmental atelectasis. Heart size
accentuated by a mild pectus excavatum deformity. No pericardial or
pleural effusion.

Hepatobiliary: Too small to characterize right liver lobe lesion.
Normal gallbladder, without biliary ductal dilatation.

Pancreas: Normal, without mass or ductal dilatation.

Spleen: Normal in size, without focal abnormality.

Adrenals/Urinary Tract: Normal adrenal glands. Normal kidneys,
without hydronephrosis. Normal urinary bladder. Degraded evaluation
of the pelvis, secondary to beam hardening artifact from left pelvic
fixation.

Stomach/Bowel: Normal stomach, without wall thickening. Fluid-filled
colon, consistent with a diarrheal state. Normal terminal ileum and
appendix. Normal small bowel.

Vascular/Lymphatic: Aortic and branch vessel atherosclerosis. No
abdominopelvic adenopathy.

Reproductive: Normal prostate.

Other: No significant free fluid.

Musculoskeletal: No acute osseous abnormality. Degenerative disc
disease at the lumbosacral junction.
IMPRESSION: 1.  No acute process in the abdomen or pelvis.
2. Fluid-filled colon, consistent with the clinical history of
diarrhea.
3.  Aortic atherosclerosis.

## 2017-09-30 ENCOUNTER — Emergency Department (HOSPITAL_COMMUNITY)
Admission: EM | Admit: 2017-09-30 | Discharge: 2017-09-30 | Disposition: A | Discharge status: Home / Self Care | Payer: Self-pay | Attending: Emergency Medicine | Admitting: Emergency Medicine

## 2017-09-30 ENCOUNTER — Encounter (HOSPITAL_COMMUNITY): Payer: Self-pay | Admitting: Emergency Medicine

## 2017-09-30 DIAGNOSIS — S0501XA Injury of conjunctiva and corneal abrasion without foreign body, right eye, initial encounter: Secondary | ICD-10-CM | POA: Insufficient documentation

## 2017-09-30 DIAGNOSIS — Y999 Unspecified external cause status: Secondary | ICD-10-CM | POA: Insufficient documentation

## 2017-09-30 DIAGNOSIS — Y929 Unspecified place or not applicable: Secondary | ICD-10-CM | POA: Insufficient documentation

## 2017-09-30 DIAGNOSIS — Y939 Activity, unspecified: Secondary | ICD-10-CM | POA: Insufficient documentation

## 2017-09-30 DIAGNOSIS — F17210 Nicotine dependence, cigarettes, uncomplicated: Secondary | ICD-10-CM | POA: Insufficient documentation

## 2017-09-30 DIAGNOSIS — W228XXA Striking against or struck by other objects, initial encounter: Secondary | ICD-10-CM | POA: Insufficient documentation

## 2017-09-30 MED ORDER — TETRACAINE HCL 0.5 % OP SOLN
2.0000 [drp] | Freq: Once | OPHTHALMIC | Status: AC
  Administered 2017-09-30: 2 [drp] via OPHTHALMIC
  Filled 2017-09-30: qty 4

## 2017-09-30 MED ORDER — POLYMYXIN B-TRIMETHOPRIM 10000-0.1 UNIT/ML-% OP SOLN
1.0000 [drp] | OPHTHALMIC | Status: DC
  Administered 2017-09-30: 1 [drp] via OPHTHALMIC
  Filled 2017-09-30: qty 10

## 2017-09-30 MED ORDER — FLUORESCEIN SODIUM 1 MG OP STRP
1.0000 | ORAL_STRIP | Freq: Once | OPHTHALMIC | Status: AC
  Administered 2017-09-30: 1 via OPHTHALMIC
  Filled 2017-09-30: qty 1

## 2017-09-30 MED ORDER — TETRACAINE HCL 0.5 % OP SOLN: 1.0000 [drp] | Freq: Once | OPHTHALMIC | Status: DC

## 2017-09-30 MED ORDER — FLUORESCEIN SODIUM 1 MG OP STRP: 1.0000 | ORAL_STRIP | Freq: Once | OPHTHALMIC | Status: DC
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
## 2019-01-21 ENCOUNTER — Emergency Department (HOSPITAL_COMMUNITY)
Admission: EM | Admit: 2019-01-21 | Discharge: 2019-01-22 | Disposition: A | Discharge status: Home / Self Care | Payer: Self-pay | Attending: Emergency Medicine | Admitting: Emergency Medicine

## 2019-01-21 DIAGNOSIS — Y939 Activity, unspecified: Secondary | ICD-10-CM | POA: Insufficient documentation

## 2019-01-21 DIAGNOSIS — S025XXA Fracture of tooth (traumatic), initial encounter for closed fracture: Secondary | ICD-10-CM | POA: Insufficient documentation

## 2019-01-21 DIAGNOSIS — Y929 Unspecified place or not applicable: Secondary | ICD-10-CM | POA: Insufficient documentation

## 2019-01-21 DIAGNOSIS — S60511A Abrasion of right hand, initial encounter: Secondary | ICD-10-CM | POA: Insufficient documentation

## 2019-01-21 DIAGNOSIS — Y999 Unspecified external cause status: Secondary | ICD-10-CM | POA: Insufficient documentation

## 2019-01-21 DIAGNOSIS — S0990XA Unspecified injury of head, initial encounter: Secondary | ICD-10-CM | POA: Insufficient documentation

## 2019-01-21 DIAGNOSIS — S0083XA Contusion of other part of head, initial encounter: Secondary | ICD-10-CM | POA: Insufficient documentation

## 2019-01-21 DIAGNOSIS — F17210 Nicotine dependence, cigarettes, uncomplicated: Secondary | ICD-10-CM | POA: Insufficient documentation

## 2019-01-21 DIAGNOSIS — F1290 Cannabis use, unspecified, uncomplicated: Secondary | ICD-10-CM | POA: Insufficient documentation

## 2019-01-21 DIAGNOSIS — S01511A Laceration without foreign body of lip, initial encounter: Secondary | ICD-10-CM | POA: Insufficient documentation
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
## 2019-01-22 ENCOUNTER — Emergency Department (HOSPITAL_COMMUNITY): Payer: Self-pay

## 2019-01-22 ENCOUNTER — Encounter (HOSPITAL_COMMUNITY): Payer: Self-pay | Admitting: Emergency Medicine

## 2019-01-22 ENCOUNTER — Other Ambulatory Visit: Payer: Self-pay

## 2019-01-22 MED ORDER — ACETAMINOPHEN 500 MG PO TABS: 1000.0000 mg | ORAL_TABLET | Freq: Three times a day (TID) | ORAL | 0 refills | Status: AC

## 2019-01-22 MED ORDER — CHLORHEXIDINE GLUCONATE 0.12 % MT SOLN: 15.0000 mL | Freq: Two times a day (BID) | OROMUCOSAL | 0 refills | Status: DC

## 2019-01-22 MED ORDER — KETOROLAC TROMETHAMINE 60 MG/2ML IM SOLN
30.0000 mg | Freq: Once | INTRAMUSCULAR | Status: AC
  Administered 2019-01-22: 30 mg via INTRAMUSCULAR
  Filled 2019-01-22: qty 2

## 2019-01-22 MED ORDER — LIDOCAINE-EPINEPHRINE 2 %-1:100000 IJ SOLN: 10.0000 mL | Freq: Once | INTRAMUSCULAR | Status: DC

## 2019-01-22 MED ORDER — LIDOCAINE-EPINEPHRINE (PF) 2 %-1:200000 IJ SOLN
10.0000 mL | Freq: Once | INTRAMUSCULAR | Status: AC
  Administered 2019-01-22: 10 mL via INTRADERMAL
  Filled 2019-01-22: qty 20

## 2019-01-22 MED ORDER — OXYCODONE-ACETAMINOPHEN 5-325 MG PO TABS
1.0000 | ORAL_TABLET | ORAL | Status: AC | PRN
  Administered 2019-01-22 (×2): 1 via ORAL
  Filled 2019-01-22 (×2): qty 1
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
## 2019-01-22 NOTE — ED Notes (Signed)
Pt discharged from ED; instructions provided and scripts given; Pt encouraged to return to ED if symptoms worsen and to f/u with PCP; Pt verbalized understanding of all instructions 
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
## 2019-07-08 ENCOUNTER — Other Ambulatory Visit: Payer: Self-pay

## 2019-07-08 ENCOUNTER — Emergency Department (HOSPITAL_COMMUNITY): Payer: Self-pay

## 2019-07-08 ENCOUNTER — Emergency Department (HOSPITAL_COMMUNITY)
Admission: EM | Admit: 2019-07-08 | Discharge: 2019-07-08 | Disposition: A | Discharge status: Home / Self Care | Payer: Self-pay | Attending: Emergency Medicine | Admitting: Emergency Medicine

## 2019-07-08 DIAGNOSIS — Y929 Unspecified place or not applicable: Secondary | ICD-10-CM | POA: Insufficient documentation

## 2019-07-08 DIAGNOSIS — Z20828 Contact with and (suspected) exposure to other viral communicable diseases: Secondary | ICD-10-CM | POA: Insufficient documentation

## 2019-07-08 DIAGNOSIS — X58XXXA Exposure to other specified factors, initial encounter: Secondary | ICD-10-CM | POA: Insufficient documentation

## 2019-07-08 DIAGNOSIS — Y999 Unspecified external cause status: Secondary | ICD-10-CM | POA: Insufficient documentation

## 2019-07-08 DIAGNOSIS — F17210 Nicotine dependence, cigarettes, uncomplicated: Secondary | ICD-10-CM | POA: Insufficient documentation

## 2019-07-08 DIAGNOSIS — F1290 Cannabis use, unspecified, uncomplicated: Secondary | ICD-10-CM | POA: Insufficient documentation

## 2019-07-08 DIAGNOSIS — M25422 Effusion, left elbow: Secondary | ICD-10-CM | POA: Insufficient documentation

## 2019-07-08 DIAGNOSIS — S46312A Strain of muscle, fascia and tendon of triceps, left arm, initial encounter: Secondary | ICD-10-CM | POA: Insufficient documentation

## 2019-07-08 DIAGNOSIS — Y939 Activity, unspecified: Secondary | ICD-10-CM | POA: Insufficient documentation

## 2019-07-08 LAB — CBC WITH DIFFERENTIAL/PLATELET
Abs Immature Granulocytes: 0.03 10*3/uL (ref 0.00–0.07)
Basophils Absolute: 0.1 10*3/uL (ref 0.0–0.1)
Basophils Relative: 0 %
Eosinophils Absolute: 0.4 10*3/uL (ref 0.0–0.5)
Eosinophils Relative: 3 %
HCT: 41.4 % (ref 39.0–52.0)
Hemoglobin: 13.6 g/dL (ref 13.0–17.0)
Immature Granulocytes: 0 %
Lymphocytes Relative: 10 %
Lymphs Abs: 1.3 10*3/uL (ref 0.7–4.0)
MCH: 30.6 pg (ref 26.0–34.0)
MCHC: 32.9 g/dL (ref 30.0–36.0)
MCV: 93 fL (ref 80.0–100.0)
Monocytes Absolute: 0.7 10*3/uL (ref 0.1–1.0)
Monocytes Relative: 6 %
Neutro Abs: 10.7 10*3/uL — ABNORMAL HIGH (ref 1.7–7.7)
Neutrophils Relative %: 81 %
Platelets: 265 10*3/uL (ref 150–400)
RBC: 4.45 MIL/uL (ref 4.22–5.81)
RDW: 12.5 % (ref 11.5–15.5)
WBC: 13.3 10*3/uL — ABNORMAL HIGH (ref 4.0–10.5)
nRBC: 0 % (ref 0.0–0.2)

## 2019-07-08 LAB — BASIC METABOLIC PANEL
Anion gap: 7 (ref 5–15)
BUN: 13 mg/dL (ref 6–20)
CO2: 26 mmol/L (ref 22–32)
Calcium: 9 mg/dL (ref 8.9–10.3)
Chloride: 106 mmol/L (ref 98–111)
Creatinine, Ser: 0.89 mg/dL (ref 0.61–1.24)
GFR calc Af Amer: >60 mL/min (ref >60)
GFR calc non Af Amer: >60 mL/min (ref >60)
Glucose, Bld: 109 mg/dL — ABNORMAL HIGH (ref 70–99)
Potassium: 4.1 mmol/L (ref 3.5–5.1)
Sodium: 139 mmol/L (ref 135–145)

## 2019-07-08 LAB — SEDIMENTATION RATE: Sed Rate: 4 mm/hr (ref 0–16)

## 2019-07-08 LAB — C-REACTIVE PROTEIN: CRP: <0.8 mg/dL (ref <1.0)

## 2019-07-08 LAB — SARS CORONAVIRUS 2 (TAT 6-24 HRS): SARS Coronavirus 2: NEGATIVE

## 2019-07-08 MED ORDER — OXYCODONE-ACETAMINOPHEN 5-325 MG PO TABS
1.0000 | ORAL_TABLET | Freq: Once | ORAL | Status: AC
  Administered 2019-07-08: 1 via ORAL
  Filled 2019-07-08: qty 1

## 2019-07-08 MED ORDER — MORPHINE SULFATE (PF) 4 MG/ML IV SOLN
4.0000 mg | Freq: Once | INTRAVENOUS | Status: AC
  Administered 2019-07-08: 4 mg via INTRAVENOUS
  Filled 2019-07-08: qty 1

## 2019-07-08 MED ORDER — DIPHENHYDRAMINE HCL 50 MG/ML IJ SOLN
25.0000 mg | Freq: Once | INTRAMUSCULAR | Status: AC
  Administered 2019-07-08: 14:00:00 25 mg via INTRAVENOUS
  Filled 2019-07-08: qty 1

## 2019-07-08 MED ORDER — HYDROCODONE-ACETAMINOPHEN 5-325 MG PO TABS: 1.0000 | ORAL_TABLET | Freq: Three times a day (TID) | ORAL | 0 refills | Status: DC | PRN

## 2019-07-08 MED ORDER — IBUPROFEN 800 MG PO TABS: 800.0000 mg | ORAL_TABLET | Freq: Three times a day (TID) | ORAL | 0 refills | Status: DC

## 2019-07-08 MED ORDER — KETOROLAC TROMETHAMINE 30 MG/ML IJ SOLN
30.0000 mg | Freq: Once | INTRAMUSCULAR | Status: AC
  Administered 2019-07-08: 16:00:00 30 mg via INTRAVENOUS
  Filled 2019-07-08: qty 1

## 2019-07-08 MED ORDER — GADOBUTROL 1 MMOL/ML IV SOLN
6.0000 mL | Freq: Once | INTRAVENOUS | Status: AC | PRN
  Administered 2019-07-08: 20:00:00 6 mL via INTRAVENOUS

## 2019-07-08 MED ORDER — DIPHENHYDRAMINE HCL 50 MG/ML IJ SOLN
25.0000 mg | Freq: Once | INTRAMUSCULAR | Status: AC
  Administered 2019-07-08: 18:00:00 25 mg via INTRAVENOUS
  Filled 2019-07-08: qty 1

## 2019-07-08 MED ORDER — ONDANSETRON HCL 4 MG/2ML IJ SOLN
4.0000 mg | Freq: Once | INTRAMUSCULAR | Status: AC
  Administered 2019-07-08: 18:00:00 4 mg via INTRAVENOUS
  Filled 2019-07-08: qty 2

## 2019-07-08 MED ORDER — ONDANSETRON HCL 4 MG/2ML IJ SOLN
4.0000 mg | Freq: Once | INTRAMUSCULAR | Status: AC
  Administered 2019-07-08: 4 mg via INTRAVENOUS
  Filled 2019-07-08: qty 2

## 2019-07-08 NOTE — ED Notes (Signed)
Provided pt heat pack for elbow

## 2019-07-08 NOTE — Discharge Instructions (Signed)
Your MRI shows that you have a partial tear of your triceps tendon with some surrounding swelling and inflammation.  Please take ibuprofen 800 mg 3 times a day, Norco as needed for breakthrough pain.  Apply ice for 20 minutes every hour, light range of motion is encouraged but avoid heavy lifting.  It is extremely important that you follow-up with Dr. Stann Mainland in the office in 1 week, please call his office using the phone number below and schedule a follow-up appointment.  If you develop worsening swelling, pain, fevers or chills call Dr. Stann Mainland office immediately for sooner follow-up or return to the ED.

## 2019-07-08 NOTE — Consult Note (Signed)
Reason for Consult:Left elbow pain Referring Physician: A Delores Taylor is an 58 y.o. male.  HPI: Jose Taylor comes to the ED with Jose 2d hx/o left elbow pain. No known antecedent event. He woke up with the pain and it's gotten steadily worse over the last 48h. Denies fevers, chills, sweats, N/V, or prior e/o. He is RHD and is currently unemployed.  No past medical history on file.  Past Surgical History:  Procedure Laterality Date  . Artificial Hip    . ELBOW SURGERY      No family history on file.  Social History:  reports that he has been smoking cigarettes. He has been smoking about 0.20 packs per day. He has never used smokeless tobacco. He reports current alcohol use. He reports current drug use. Drug: Marijuana.  Allergies:  Allergies  Allergen Reactions  . Gadolinium Derivatives Nausea And Vomiting  . Penicillins Itching    Has patient had Jose PCN reaction causing immediate rash, facial/tongue/throat swelling, SOB or lightheadedness with hypotension: No Has patient had Jose PCN reaction causing severe rash involving mucus membranes or skin necrosis: No Has patient had Jose PCN reaction that required hospitalization No Has patient had Jose PCN reaction occurring within the last 10 years: No If all of the above answers are "NO", then may proceed with Cephalosporin use.     Medications: I have reviewed the patient's current medications.  No results found for this or any previous visit (from the past 48 hour(s)).  Dg Elbow 2 Views Left  Result Date: 07/08/2019 CLINICAL DATA:  58 year old male with left elbow pain. EXAM: LEFT ELBOW - 2 VIEW COMPARISON:  Left upper extremity radiograph dated 12/26/2015 FINDINGS: There is no acute fracture or dislocation. The bones are well mineralized. No significant arthritic changes. Curvilinear bony density posterior to the olecranon as well as Jose small bone fragment appear chronic. There is however soft tissue swelling over the dorsal  aspect of the elbow concerning for olecranon bursitis. Clinical correlation is recommended. Ultrasound may provide better evaluation. IMPRESSION: 1. No acute fracture or dislocation. 2. Findings concerning for olecranon bursitis. Clinical correlation is recommended. Electronically Signed   By: [name] M.D.   On: 07/08/2019 10:31    Review of Systems  Constitutional: Negative for weight loss.  HENT: Negative for ear discharge, ear pain, hearing loss and tinnitus.   Eyes: Negative for blurred vision, double vision, photophobia and pain.  Respiratory: Negative for cough, sputum production and shortness of breath.   Cardiovascular: Negative for chest pain.  Gastrointestinal: Negative for abdominal pain, nausea and vomiting.  Genitourinary: Negative for dysuria, flank pain, frequency and urgency.  Musculoskeletal: Positive for joint pain (Left elbow). Negative for back pain, falls, myalgias and neck pain.  Neurological: Negative for dizziness, tingling, sensory change, focal weakness, loss of consciousness and headaches.  Endo/Heme/Allergies: Does not bruise/bleed easily.  Psychiatric/Behavioral: Negative for depression, memory loss and substance abuse. The patient is not nervous/anxious.    Blood pressure 120/89, pulse 83, temperature 98.4 F (36.9 C), temperature source Oral, SpO2 98 %. Physical Exam  Constitutional: He appears well-developed and well-nourished. No distress.  HENT:  Head: Normocephalic and atraumatic.  Eyes: Conjunctivae are normal. Right eye exhibits no discharge. Left eye exhibits no discharge. No scleral icterus.  Neck: Normal range of motion.  Cardiovascular: Normal rate and regular rhythm.  Respiratory: Effort normal. No respiratory distress.  Musculoskeletal:     Comments: Left shoulder, elbow, wrist, digits- no skin wounds, severe TTP posterior elbow,  no bogginess or erythema, no instability, no blocks to motion, AROM ~15 degrees, PROM ~30 degrees  Sens   Ax/R/M/U intact  Mot   Ax/ R/ PIN/ M/ AIN/ U intact  Rad 2+  Neurological: He is alert.  Skin: Skin is warm and dry. He is not diaphoretic.  Psychiatric: He has Jose normal mood and affect. His behavior is normal.    Assessment/Plan: Left elbow pain -- Exam not really c/w septic bursitis. I do worry about septic arthritis given his pain and limited ROM. Will check CBC and inflammatory markers. Ordered MRI of elbow. Dr. [name] to f/u later today. Please keep NPO.    [name], PA-C Orthopedic Surgery [phone] 07/08/2019, 1:15 PM

## 2019-07-08 NOTE — ED Notes (Signed)
Patient transported to MRI 

## 2019-07-08 NOTE — ED Notes (Signed)
SisterFerdinand Lango- [phone] and [phone]- would like to be updated on pt's status.

## 2019-07-08 NOTE — ED Triage Notes (Signed)
Pt presents w/Left elbow pain and swelling. Denies any Recent injury. States in a cast 4 years ago.

## 2019-07-08 NOTE — ED Notes (Signed)
Patient Alert and oriented to baseline. Stable and ambulatory to baseline. Patient verbalized understanding of the discharge instructions.  Patient belongings were taken by the patient.   

## 2019-07-08 NOTE — ED Notes (Signed)
Ice pack given to pt.

## 2019-07-08 NOTE — ED Provider Notes (Signed)
[facility] EMERGENCY DEPARTMENT Provider Note   CSN: [id number] Arrival date & time: 07/08/19  0945     History   Chief Complaint Chief Complaint  Patient presents with  . Elbow Pain    HPI Jose Taylor is a 58 y.o. male.     Jose Taylor is a 58 y.o. male who is otherwise healthy, presents to the ED for evaluation of left elbow pain.  Patient reports that pain started yesterday and significantly worsened today. Patient reports pain is a constant severe ache that is made worse with any movement or palpation.  He has noted some swelling but no redness.  No fevers or chills.  No nausea or vomiting.  He denies any injury or trauma to the elbow.  Reports remote history of previous fracture.  He denies any pain in the surrounding joints.  No numbness tingling or weakness.  Denies any IV drug use.  Has not taken anything.  No other aggravating or alleviating factors.     No past medical history on file.  There are no active problems to display for this patient.   Past Surgical History:  Procedure Laterality Date  . Artificial Hip    . ELBOW SURGERY          Home Medications    Prior to Admission medications   Medication Sig Start Date End Date Taking? Authorizing Provider  chlorhexidine (PERIDEX) 0.12 % solution Use as directed 15 mLs in the mouth or throat 2 (two) times daily. 01/22/19   Cardama, Grayce Sessions, MD  HYDROcodone-acetaminophen (NORCO) 5-325 MG tablet Take 1 tablet by mouth every 8 (eight) hours as needed. 07/08/19   Jacqlyn Larsen, PA-C  ibuprofen (ADVIL) 800 MG tablet Take 1 tablet (800 mg total) by mouth 3 (three) times daily. 07/08/19   Jacqlyn Larsen, PA-C  methocarbamol (ROBAXIN) 500 MG tablet Can take up to 1-2 tabs every 6 hours PRN PAIN 08/30/16   Pisciotta, Elmyra Ricks, PA-C    Family History No family history on file.  Social History Social History   Tobacco Use  . Smoking status: Current Every Day Smoker   Packs/day: 0.20    Types: Cigarettes  . Smokeless tobacco: Never Used  Substance Use Topics  . Alcohol use: Yes    Comment: 3 beers per day  . Drug use: Yes    Types: Marijuana    Comment: "occasional pot"     Allergies   Gadolinium derivatives, Morphine and related, and Penicillins   Review of Systems Review of Systems  Constitutional: Negative for chills and fever.  Respiratory: Negative for shortness of breath.   Cardiovascular: Negative for chest pain.  Gastrointestinal: Negative for nausea and vomiting.  Musculoskeletal: Positive for arthralgias and joint swelling.  Skin: Negative for color change, rash and wound.  Neurological: Negative for weakness and numbness.  All other systems reviewed and are negative.    Physical Exam Updated Vital Signs BP 120/89   Pulse 83   Temp 98.4 F (36.9 C) (Oral)   SpO2 98%   Physical Exam Vitals signs and nursing note reviewed.  Constitutional:      General: He is not in acute distress.    Appearance: Normal appearance. He is well-developed. He is not diaphoretic.     Comments: Patient appears very uncomfortable  HENT:     Head: Normocephalic and atraumatic.  Eyes:     General:        Right eye: No discharge.  Left eye: No discharge.  Cardiovascular:     Rate and Rhythm: Normal rate and regular rhythm.     Pulses: Normal pulses.     Heart sounds: Normal heart sounds.  Pulmonary:     Effort: Pulmonary effort is normal. No respiratory distress.  Musculoskeletal:     Comments: Severe tenderness to palpation over the left elbow, particularly over the posterior portion with small amount of swelling and warmth noted.  There is no erythema.  Patient is unable to range the elbow more than 15 degrees due to severe pain.  No pain at the shoulder or wrist.  2+ radial pulse, 5/5 grip strength but unable to test strength at the elbow due to pain.  Normal sensation.  Skin:    General: Skin is warm and dry.     Capillary  Refill: Capillary refill takes less than 2 seconds.  Neurological:     Mental Status: He is alert and oriented to person, place, and time.     Coordination: Coordination normal.  Psychiatric:        Mood and Affect: Mood normal.        Behavior: Behavior normal.      ED Treatments / Results  Labs (all labs ordered are listed, but only abnormal results are displayed) Labs Reviewed  CBC WITH DIFFERENTIAL/PLATELET - Abnormal; Notable for the following components:      Result Value   WBC 13.3 (*)    Neutro Abs 10.7 (*)    All other components within normal limits  BASIC METABOLIC PANEL - Abnormal; Notable for the following components:   Glucose, Bld 109 (*)    All other components within normal limits  SARS CORONAVIRUS 2 (TAT 6-24 HRS)  SEDIMENTATION RATE  C-REACTIVE PROTEIN    EKG None  Radiology Dg Elbow 2 Views Left  Result Date: 07/08/2019 CLINICAL DATA:  58 year old male with left elbow pain. EXAM: LEFT ELBOW - 2 VIEW COMPARISON:  Left upper extremity radiograph dated 12/26/2015 FINDINGS: There is no acute fracture or dislocation. The bones are well mineralized. No significant arthritic changes. Curvilinear bony density posterior to the olecranon as well as a small bone fragment appear chronic. There is however soft tissue swelling over the dorsal aspect of the elbow concerning for olecranon bursitis. Clinical correlation is recommended. Ultrasound may provide better evaluation. IMPRESSION: 1. No acute fracture or dislocation. 2. Findings concerning for olecranon bursitis. Clinical correlation is recommended. Electronically Signed   By: [name] M.D.   On: 07/08/2019 10:31   Mr Elbow Left W Wo Contrast  Result Date: 07/08/2019 CLINICAL DATA:  Elbow pain bursitis EXAM: MRI OF THE LEFT ELBOW WITHOUT AND WITH CONTRAST TECHNIQUE: Multiplanar, multisequence MR imaging of the elbow was performed before and after the administration of intravenous contrast. CONTRAST:  31m  GADAVIST GADOBUTROL 1 MMOL/ML IV SOLN COMPARISON:  July 08, 2019 FINDINGS: TENDONS Common forearm flexor origin: Intact with normal signal. Common forearm extensor origin: There is increased intrasubstance signal and thickening seen throughout the common extensor tendon, however it is intact. Biceps: Intact. Brachialis: Intact Triceps: Partial interstitial tear of the triceps tendon extending from the insertion site approximately 2.6 cm. At the insertion site there is a focal high-grade partial tear measuring approximately 7 mm and transverse dimension. There are however intact fibers seen throughout. There is enhancing edema seen throughout the triceps musculature. A small amount of fluid is seen anterior to the olecranon. There is trace olecranon bursal fluid and overlying soft tissue swelling. LIGAMENTS  Medial stabilizers: The ulnar collateral ligament is intact. Lateral stabilizers: The lateral ulnar and radial collateral ligaments appear intact. Cartilage: Mild joint space loss and chondral thinning seen at the ulnar trochlear surface. There is cystic change seen at the anterior coronoid process. There is also mild chondral irregularity seen at the radiocapitellar joint the underlying subchondral marrow signal change. Joint: A moderate elbow joint effusion is seen. A 6 mm loose body is seen within the anterior olecranon fossa measuring 7 mm, series 5, image 16. There is also small loose bodies seen in the posterior joint space just superior to the olecranon process best seen on series 12, image 14 at the largest measuring 8 mm. Cubital tunnel: Unremarkable.  The ulnar nerve appears normal. Bones: No fracture, marrow edema, or pathologic marrow abnormality. Other: There is feathery signal seen within the anconeus musculature. No focal soft tissue mass is seen. There is soft tissue swelling seen over the posterior elbow. IMPRESSION: 1. Partial high-grade insertional tear of the triceps tendon with  myotendinous edema and overlying soft tissue edema. 2. 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 3. Subcentimeter scattered loose bodies within the anterior olecranon fossa and posterior joint space just superior to the olecranon process as described above. 4. A moderate elbow joint effusion 5. Common flexor tendinosis 6. Anconeus muscular edema Electronically Signed   By: Prudencio Pair M.D.   On: 07/08/2019 19:51    Procedures Procedures (including critical care time)  Medications Ordered in ED Medications - No data to display   Initial Impression / Assessment and Plan / ED Course  I have reviewed the triage vital signs and the nursing notes.  Pertinent labs & imaging results that were available during my care of the patient were reviewed by me and considered in my medical decision making (see chart for details).  58 year old male presents to the ED For evaluation of severe left elbow pain and swelling which started yesterday.  He denies any recent trauma or injury.  There is some warmth with palpation over the back of the elbow but no erythema, slight swelling.  X-ray shows no evidence of fracture or dislocation raises concern for olecranon bursitis.  Patient is extremely uncomfortable and unable to range the elbow more than 15 degrees.  Will consult orthopedics.  Case discussed with PA Orion Crook with Ortho who will see and evaluate the patient.  Pain medication ordered.  Orthopedics PA seen and evaluated patient and discussed with Dr. Stann Mainland.  We will get basic labs, ESR, CRP and MRI with contrast.  Additional pain medication ordered.  If patient's creatinine is within normal limits they recommend giving Toradol as well.  Concern for potential infection although there is not significant swelling on exam.  No antibiotics or additional treatment until MRI obtained.  Lab work has been reassuring overall, there is a slight leukocytosis of 13.3, no  significant electrolyte derangements, ESR and CRP are not elevated.  Pain significantly improved after morphine and Toradol.  MRI shows partial tear of the triceps tendon with myotendinous edema, there is some mild osteoarthritis, and some small subcentimeter loose bodies likely related to patient's previous fracture.  Moderate joint effusion noted.  Case discussed with Dr. Stann Mainland with orthopedics who is seen and evaluated the patient as well.  Given triceps tendon tear feel this is more likely inflammatory than infectious, Dr. Stann Mainland would like to treat the patient with 1 week of ibuprofen 800 mg 3 times daily and he will see the patient  for close follow-up in his office.  I discussed this plan with the patient as well as strict return precautions and he expresses understanding and agreement.  Discharged home in good condition.  Final Clinical Impressions(s) / ED Diagnoses   Final diagnoses:  Rupture of left triceps tendon, initial encounter  Effusion of left elbow    ED Discharge Orders         Ordered    ibuprofen (ADVIL) 800 MG tablet  3 times daily     07/08/19 2035    HYDROcodone-acetaminophen (NORCO) 5-325 MG tablet  Every 8 hours PRN     07/08/19 2035           Jacqlyn Larsen, PA-C 07/11/19 2135    Varney Biles, MD 07/13/19 1812

## 2019-07-08 NOTE — ED Notes (Signed)
Clarified with lab that pt's C reactive protein needs to be run. Lab d/ced twice. Lab will be run now in main lab.

## 2019-08-18 ENCOUNTER — Ambulatory Visit
Admission: EM | Admit: 2019-08-18 | Discharge: 2019-08-18 | Disposition: A | Discharge status: Home / Self Care | Payer: Self-pay | Attending: Physician Assistant | Admitting: Physician Assistant

## 2019-08-18 DIAGNOSIS — H9192 Unspecified hearing loss, left ear: Secondary | ICD-10-CM

## 2019-08-18 MED ORDER — FLUTICASONE PROPIONATE 50 MCG/ACT NA SUSP: 2.0000 | Freq: Every day | NASAL | 0 refills | Status: DC
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
## 2019-08-18 NOTE — Discharge Instructions (Signed)
Start flonase as directed daily for the next 1-2 weeks. Follow up with ENT if symptoms not improving.

## 2019-11-24 ENCOUNTER — Other Ambulatory Visit: Payer: Self-pay

## 2019-11-24 ENCOUNTER — Ambulatory Visit: Payer: Self-pay | Attending: Internal Medicine

## 2019-11-24 DIAGNOSIS — Z20822 Contact with and (suspected) exposure to covid-19: Secondary | ICD-10-CM

## 2019-11-25 LAB — SARS-COV-2, NAA 2 DAY TAT

## 2019-11-25 LAB — NOVEL CORONAVIRUS, NAA: SARS-CoV-2, NAA: NOT DETECTED

## 2019-12-02 ENCOUNTER — Other Ambulatory Visit: Payer: Self-pay

## 2019-12-02 ENCOUNTER — Emergency Department (HOSPITAL_COMMUNITY)
Admission: EM | Admit: 2019-12-02 | Discharge: 2019-12-02 | Disposition: A | Discharge status: Home / Self Care | Payer: Self-pay | Attending: Emergency Medicine | Admitting: Emergency Medicine

## 2019-12-02 DIAGNOSIS — F17210 Nicotine dependence, cigarettes, uncomplicated: Secondary | ICD-10-CM | POA: Insufficient documentation

## 2019-12-02 DIAGNOSIS — R369 Urethral discharge, unspecified: Secondary | ICD-10-CM | POA: Insufficient documentation

## 2019-12-02 DIAGNOSIS — R300 Dysuria: Secondary | ICD-10-CM | POA: Insufficient documentation

## 2019-12-02 LAB — URINALYSIS, ROUTINE W REFLEX MICROSCOPIC
Bilirubin Urine: NEGATIVE
Glucose, UA: NEGATIVE mg/dL
Hgb urine dipstick: NEGATIVE
Ketones, ur: NEGATIVE mg/dL
Nitrite: NEGATIVE
Protein, ur: NEGATIVE mg/dL
Specific Gravity, Urine: 1.02 (ref 1.005–1.030)
WBC, UA: >50 WBC/hpf — ABNORMAL HIGH (ref 0–5)
pH: 5 (ref 5.0–8.0)

## 2019-12-02 MED ORDER — DOXYCYCLINE HYCLATE 100 MG PO TABS
100.0000 mg | ORAL_TABLET | Freq: Once | ORAL | Status: AC
  Administered 2019-12-02: 11:00:00 100 mg via ORAL
  Filled 2019-12-02: qty 1

## 2019-12-02 MED ORDER — DIPHENHYDRAMINE HCL 25 MG PO CAPS
25.0000 mg | ORAL_CAPSULE | Freq: Once | ORAL | Status: AC
  Administered 2019-12-02: 25 mg via ORAL
  Filled 2019-12-02: qty 1

## 2019-12-02 MED ORDER — CEFTRIAXONE SODIUM 500 MG IJ SOLR
500.0000 mg | Freq: Once | INTRAMUSCULAR | Status: AC
  Administered 2019-12-02: 11:00:00 500 mg via INTRAMUSCULAR
  Filled 2019-12-02: qty 500

## 2019-12-02 MED ORDER — STERILE WATER FOR INJECTION IJ SOLN
INTRAMUSCULAR | Status: AC
  Filled 2019-12-02: qty 10

## 2019-12-02 MED ORDER — DOXYCYCLINE HYCLATE 100 MG PO CAPS: 100.0000 mg | ORAL_CAPSULE | Freq: Two times a day (BID) | ORAL | 0 refills | Status: AC

## 2019-12-02 NOTE — ED Provider Notes (Signed)
[facility] EMERGENCY DEPARTMENT Provider Note   CSN: [id number] Arrival date & time: 12/02/19  5852     History Chief Complaint  Patient presents with  . SEXUALLY TRANSMITTED DISEASE    Jose Taylor is a 59 y.o. male.  HPI Is a 59 year old gentleman with no past medical history presented today for concern for STI.  Patient states he has a history many years ago of gonorrhea and was treated.  Has not had issues since.  He states that 3 weeks ago he had unprotected sex with a partner who is male.  He states vaginal sex only.  He states that she states that she has no sexually transmitted uses however she has not been tested recently.  He states that he has had some burning with urination and clear penile discharge the past 3 days.  Patient denies any pain other than when he is urinating.  Denies any nausea or vomiting.  Denies any fevers or chills.    No past medical history on file.  There are no problems to display for this patient.   Past Surgical History:  Procedure Laterality Date  . Artificial Hip    . ELBOW SURGERY         No family history on file.  Social History   Tobacco Use  . Smoking status: Current Every Day Smoker    Packs/day: 0.20    Types: Cigarettes  . Smokeless tobacco: Never Used  Substance Use Topics  . Alcohol use: Yes    Comment: 3 beers per day  . Drug use: Yes    Types: Marijuana    Comment: "occasional pot"    Home Medications Prior to Admission medications   Medication Sig Start Date End Date Taking? Authorizing Provider  doxycycline (VIBRAMYCIN) 100 MG capsule Take 1 capsule (100 mg total) by mouth 2 (two) times daily for 7 days. 12/02/19 12/09/19  Tedd Sias, PA  fluticasone (FLONASE) 50 MCG/ACT nasal spray Place 2 sprays into both nostrils daily. 08/18/19   Tasia Catchings, Amy V, PA-C    Allergies    Gadolinium derivatives, Morphine and related, and Penicillins  Review of Systems   Review of Systems    Constitutional: Negative for chills and fever.  HENT: Negative for congestion.   Respiratory: Negative for shortness of breath.   Cardiovascular: Negative for chest pain.  Gastrointestinal: Negative for abdominal pain.  Genitourinary: Positive for discharge and dysuria.  Musculoskeletal: Negative for neck pain.    Physical Exam Updated Vital Signs BP 109/69 (BP Location: Left Arm)   Pulse 93   Temp 98.6 F (37 C) (Oral)   Resp 16   Ht 5\' 7"  (1.702 m)   Wt 70.3 kg   SpO2 99%   BMI 24.28 kg/m   Physical Exam Vitals and nursing note reviewed.  Constitutional:      General: He is not in acute distress.    Appearance: Normal appearance. He is not ill-appearing.  HENT:     Head: Normocephalic and atraumatic.     Mouth/Throat:     Mouth: Mucous membranes are moist.  Eyes:     General: No scleral icterus.       Right eye: No discharge.        Left eye: No discharge.     Conjunctiva/sclera: Conjunctivae normal.  Pulmonary:     Effort: Pulmonary effort is normal.     Breath sounds: No stridor.  Abdominal:     Palpations: Abdomen is soft.  Tenderness: There is no abdominal tenderness. There is no right CVA tenderness, left CVA tenderness, guarding or rebound.  Genitourinary:    Comments: Penis with scant cloudy white discharge.  No testicular tenderness to palpation or palpable abnormalities.  Rectal exam deferred. Neurological:     Mental Status: He is alert and oriented to person, place, and time. Mental status is at baseline.     ED Results / Procedures / Treatments   Labs (all labs ordered are listed, but only abnormal results are displayed) Labs Reviewed  URINALYSIS, ROUTINE W REFLEX MICROSCOPIC - Abnormal; Notable for the following components:      Result Value   APPearance HAZY (*)    Leukocytes,Ua LARGE (*)    WBC, UA >50 (*)    Bacteria, UA RARE (*)    All other components within normal limits  GC/CHLAMYDIA PROBE AMP ([facility]) NOT AT [facility]     EKG None  Radiology No results found.  Procedures Procedures (including critical care time)  Medications Ordered in ED Medications  sterile water (preservative free) injection (has no administration in time range)  cefTRIAXone (ROCEPHIN) injection 500 mg (500 mg Intramuscular Given 12/02/19 1032)  diphenhydrAMINE (BENADRYL) capsule 25 mg (25 mg Oral Given 12/02/19 1032)  doxycycline (VIBRA-TABS) tablet 100 mg (100 mg Oral Given 12/02/19 1032)    ED Course  I have reviewed the triage vital signs and the nursing notes.  Pertinent labs & imaging results that were available during my care of the patient were reviewed by me and considered in my medical decision making (see chart for details).    MDM Rules/Calculators/A&P                      Patient 59 year old male with history unprotected sex presented today for concern for STI.  Patient is concerned for gonorrhea chlamydia.  He states he will follow-up with  wellness clinic for complete STI panel including HIV, RPR, etc.  He says that he would like to begin treatment today.  On physical exam he does have scant white discharge from his penis.  No other abnormalities noted.  No rash or ulcer.  No inguinal lymphadenopathy.  No fevers.  Vital signs are within normal notes.  Patient is agreeable to treatment antibiotics at this time.  Swab of urethra conducted for collection of discharge.  Patient received 500 mg of Rocephin with Benadryl since he has had itching with penicillin in the past.  He also received 1 dose of doxycycline.  Patient referred to [facility] health wellness clinic.  He will follow-up with them.  Long patient education discussion on STIs risks and treatment.  He understands no sexual contact for 2 weeks.  He will call sexual partner and any other several partners t discuss with them Need for testing.   Final Clinical Impression(s) / ED Diagnoses Final diagnoses:  Penile discharge    Rx / DC Orders ED  Discharge Orders         Ordered    doxycycline (VIBRAMYCIN) 100 MG capsule  2 times daily     12/02/19 1024           Solon Augusta [place], [place] 12/02/19 1109    Eber Hong, MD 12/03/19 [phone]

## 2019-12-02 NOTE — ED Triage Notes (Signed)
Pt here for burning with urination and penile discharge x 3 days. Concerned for std.

## 2019-12-02 NOTE — Discharge Instructions (Addendum)
Please follow-up with the [facility] for routine medical care and reevaluation.  Please take the doxycycline antibiotic twice daily for the next week.  You have been treated in the emergency department for an infection, possibly sexually transmitted. Results of your gonorrhea and chlamydia tests are pending and you will be notified if they are positive. Please refrain from intercourse for 7 days and until all sex partners (within previous 60 days) are evaluated and/or treated as well. Please follow up with your primary care provider for continued care and further STD evaluation.  It is very important to practice safe sex and use condoms when sexually active. If your results are positive you need to notify all sexual partners so they can be treated as well. The website https://dean.info/ can be used to send anonymous text messages or emails to alert sexual contacts. Follow up with your doctor   Gonorrhea and Chlamydia SYMPTOMS  In females, symptoms may go unnoticed. Symptoms that are more noticeable can include:  Belly (abdominal) pain.  Painful intercourse.  Watery mucous-like discharge from the vagina.  Miscarriage.  Discomfort when urinating.  Inflammation of the rectum.  Abnormal gray-green frothy vaginal discharge  Vaginal itching and irritatio  Itching and irritation of the area outside the vagina.   Painful urination.  Bleeding after sexual intercourse.  In males, symptoms include:  Burning with urination.  Pain in the testicles.  Watery mucous-like discharge from the penis.  It can cause longstanding (chronic) pelvic pain after frequent infections.  TREATMENT  PID can cause women to not be able to have children (sterile) if left untreated or if half-treated.  It is important to finish ALL medications given to you.  This is a sexually transmitted infection. So you are also at risk for other sexually transmitted diseases, including HIV (AIDS), it is  recommended that you get tested. HOME CARE INSTRUCTIONS  Warning: This infection is contagious. Do not have sex until treatment is completed. Follow up at your caregiver's office or the clinic to which you were referred. If your diagnosis (learning what is wrong) is confirmed by culture or some other method, your recent sexual contacts need treatment. Even if they are symptom free or have a negative culture or evaluation, they should be treated.  PREVENTION  Women should use sanitary pads instead of tampons for vaginal discharge.  Wipe front to back after using the toilet and avoid douching.   Practice safe sex, use condoms, have only one sex partner and be sure your sex partner is not having sex with others.  Ask your caregiver to test you for chlamydia at your regular checkups or sooner if you are having symptoms.  Ask for further information if you are pregnant.  SEEK IMMEDIATE MEDICAL CARE IF:  You develop an oral temperature above 102 F (38.9 C), not controlled by medications or lasting more than 2 days.  You develop an increase in pain.  You develop any type of abnormal discharge.  You develop vaginal bleeding and it is not time for your period.  You develop painful intercourse.   Bacterial Vaginosis  Bacterial vaginosis (BV) is a vaginal infection where the normal balance of bacteria in the vagina is disrupted. This is not a sexually transmitted disease and your sexual partners do NOT need to be treated. CAUSES  The cause of BV is not fully understood. BV develops when there is an increase or imbalance of harmful bacteria.  Some activities or behaviors can upset the normal  balance of bacteria in the vagina and put women at increased risk including:  Having a new sex partner or multiple sex partners.  Douching.  Using an intrauterine device (IUD) for contraception.  It is not clear what role sexual activity plays in the development of BV. However, women that have never had sexual  intercourse are rarely infected with BV.  Women do not get BV from toilet seats, bedding, swimming pools or from touching objects around them.   SYMPTOMS  Grey vaginal discharge.  A fish-like odor with discharge, especially after sexual intercourse.  Itching or burning of the vagina and vulva.  Burning or pain with urination.  Some women have no signs or symptoms at all.   TREATMENT  Sometimes BV will clear up without treatment.  BV may be treated with antibiotics.  BV can recur after treatment. If this happens, a second round of antibiotics will often be prescribed.  HOME CARE INSTRUCTIONS  Finish all medication as directed by your caregiver.  Do not have sex until treatment is completed.  Do NOT drink any alcoholic beverages while being treated  with Metronidazole (Flagyl). This will cause a severe reaction inducing vomiting.  RESOURCE GUIDE  Dental Problems  Patients with Medicaid: [facility] [phone] [address]                                           [phone] [address] Phone:  [phone]                                                  Phone:  [phone]  If unable to pay or uninsured, contact:  [facility] or [facility] to become qualified for the adult dental clinic.  Chronic Pain Problems Contact [facility]  [phone] Patients need to be referred by their primary care doctor.  Insufficient Money for Medicine Contact [employer]:  call "211" or [facility] 262-532-2962.  No Primary Care Doctor Call [facility]  [phone] Other agencies that provide inexpensive medical care    [name]  [phone]    [facility]  [phone]    [facility]  [phone]    [facility]  [phone]    [facility]  [phone]    [facility]  [phone]  Psychological Services [facility]  [phone] [facility] Services   [phone] [facility] Mental Health   [phone] (emergency services [phone])  Substance Abuse Resources [facility]  [phone] [facility] [phone] [facility] [place] [phone] Floydene Flock [phone]) [phone] Residential & Outpatient Substance Abuse Program  [phone]  Abuse/Neglect [facility] Child Abuse Hotline 716-423-7758 [facility] Child Abuse Hotline [phone] (After Hours)  Emergency Shelter [facility] (541)558-9382  Maternity Homes [facility] [place] [facility] (781)834-1512 [facility] [phone]  MRSA Hotline #:   (503) 086-5695    [facility]  [facility] Dept. [address] [place]                       [address]      [address]  Blondell Reveal Phone:  [phone]                                   Phone:  [phone]                 Phone:  [phone]  [facility] Phone:  [phone]

## 2019-12-02 NOTE — ED Notes (Signed)
Patient verbalizes understanding of discharge instructions. Opportunity for questioning and answers were provided. Armband removed by staff, pt discharged from ED.  

## 2019-12-03 LAB — GC/CHLAMYDIA PROBE AMP (~~LOC~~) NOT AT ARMC
Chlamydia: NEGATIVE
Comment: NEGATIVE
Comment: NORMAL
Neisseria Gonorrhea: POSITIVE — AB

## 2020-06-26 ENCOUNTER — Encounter (HOSPITAL_COMMUNITY): Payer: Self-pay | Admitting: Emergency Medicine

## 2020-06-26 ENCOUNTER — Emergency Department (HOSPITAL_COMMUNITY): Payer: Self-pay

## 2020-06-26 ENCOUNTER — Emergency Department (HOSPITAL_COMMUNITY)
Admission: EM | Admit: 2020-06-26 | Discharge: 2020-06-26 | Disposition: A | Discharge status: Home / Self Care | Payer: Self-pay | Attending: Emergency Medicine | Admitting: Emergency Medicine

## 2020-06-26 ENCOUNTER — Other Ambulatory Visit: Payer: Self-pay

## 2020-06-26 DIAGNOSIS — W11XXXA Fall on and from ladder, initial encounter: Secondary | ICD-10-CM | POA: Insufficient documentation

## 2020-06-26 DIAGNOSIS — Y9389 Activity, other specified: Secondary | ICD-10-CM | POA: Insufficient documentation

## 2020-06-26 DIAGNOSIS — Z79899 Other long term (current) drug therapy: Secondary | ICD-10-CM | POA: Insufficient documentation

## 2020-06-26 DIAGNOSIS — F17210 Nicotine dependence, cigarettes, uncomplicated: Secondary | ICD-10-CM | POA: Insufficient documentation

## 2020-06-26 DIAGNOSIS — Y999 Unspecified external cause status: Secondary | ICD-10-CM | POA: Insufficient documentation

## 2020-06-26 DIAGNOSIS — Y929 Unspecified place or not applicable: Secondary | ICD-10-CM | POA: Insufficient documentation

## 2020-06-26 DIAGNOSIS — S63281A Dislocation of proximal interphalangeal joint of left index finger, initial encounter: Secondary | ICD-10-CM | POA: Insufficient documentation

## 2020-06-26 DIAGNOSIS — W19XXXA Unspecified fall, initial encounter: Secondary | ICD-10-CM

## 2020-06-26 DIAGNOSIS — S52182A Other fracture of upper end of left radius, initial encounter for closed fracture: Secondary | ICD-10-CM | POA: Insufficient documentation

## 2020-06-26 DIAGNOSIS — S63251A Unspecified dislocation of left index finger, initial encounter: Secondary | ICD-10-CM

## 2020-06-26 MED ORDER — BUPIVACAINE HCL 0.5 % IJ SOLN
50.0000 mL | Freq: Once | INTRAMUSCULAR | Status: AC
  Administered 2020-06-26: 50 mL
  Filled 2020-06-26: qty 50

## 2020-06-26 MED ORDER — FENTANYL CITRATE (PF) 100 MCG/2ML IJ SOLN
100.0000 ug | Freq: Once | INTRAMUSCULAR | Status: AC
  Administered 2020-06-26: 100 ug via INTRAVENOUS
  Filled 2020-06-26: qty 2

## 2020-06-26 MED ORDER — IBUPROFEN 600 MG PO TABS: 600.0000 mg | ORAL_TABLET | Freq: Four times a day (QID) | ORAL | 0 refills | Status: DC | PRN

## 2020-06-26 MED ORDER — OXYCODONE-ACETAMINOPHEN 5-325 MG PO TABS
1.0000 | ORAL_TABLET | Freq: Once | ORAL | Status: AC
  Administered 2020-06-26: 1 via ORAL
  Filled 2020-06-26: qty 1

## 2020-06-26 MED ORDER — ONDANSETRON HCL 4 MG/2ML IJ SOLN
4.0000 mg | Freq: Once | INTRAMUSCULAR | Status: AC
  Administered 2020-06-26: 4 mg via INTRAVENOUS
  Filled 2020-06-26: qty 2

## 2020-06-26 MED ORDER — HYDROCODONE-ACETAMINOPHEN 5-325 MG PO TABS
1.0000 | ORAL_TABLET | ORAL | 0 refills | Status: DC | PRN
Start: 2020-06-26 — End: 2020-12-24

## 2020-06-26 NOTE — ED Triage Notes (Signed)
Pt fell off the top of a tractor trailer approx 30 min ago.  C/o severe pain to R elbow, R wrist, and L index finger.

## 2020-06-26 NOTE — Progress Notes (Signed)
Orthopedic Tech Progress Note Patient Details:  Jose Taylor 1961-02-22 [id number]  Ortho Devices Type of Ortho Device: Finger splint, Post (long arm) splint Ortho Device/Splint Location: RUE, LUE Ortho Device/Splint Interventions: Ordered, Application, Adjustment   Post Interventions Patient Tolerated: Well Instructions Provided: Adjustment of device, Care of device, Poper ambulation with device   Minh Roanhorse A [name] 06/26/2020, 6:05 PM

## 2020-06-26 NOTE — ED Provider Notes (Signed)
[facility] EMERGENCY DEPARTMENT Provider Note   CSN: [id number] Arrival date & time: 06/26/20  1301     History Chief Complaint  Patient presents with  . Fall    Jose Taylor is a 59 y.o. male.  Pt presents to the ED today with right elbow, right wrist, and left index finger pain after a fall.  Pt said he fell off the top of a tractor trailer.  He was up on a ladder painting the top.  The ladder slipped on some oil on the ground. He denies hitting his head.  No loc.  No blood thinners.          History reviewed. No pertinent past medical history.  There are no problems to display for this patient.   Past Surgical History:  Procedure Laterality Date  . Artificial Hip    . ELBOW SURGERY         No family history on file.  Social History   Tobacco Use  . Smoking status: Current Every Day Smoker    Packs/day: 0.20    Types: Cigarettes  . Smokeless tobacco: Never Used  Substance Use Topics  . Alcohol use: Yes    Comment: 3 beers per day  . Drug use: Yes    Types: Marijuana    Comment: "occasional pot"    Home Medications Prior to Admission medications   Medication Sig Start Date End Date Taking? Authorizing Provider  fluticasone (FLONASE) 50 MCG/ACT nasal spray Place 2 sprays into both nostrils daily. 08/18/19   Cathie Hoops, Amy V, PA-C  HYDROcodone-acetaminophen (NORCO/VICODIN) 5-325 MG tablet Take 1 tablet by mouth every 4 (four) hours as needed. 06/26/20   Jacalyn Lefevre, MD  ibuprofen (ADVIL) 600 MG tablet Take 1 tablet (600 mg total) by mouth every 6 (six) hours as needed. 06/26/20   Jacalyn Lefevre, MD    Allergies    Gadolinium derivatives, Morphine and related, and Penicillins  Review of Systems   Review of Systems  Musculoskeletal:       Right elbow, right wrist, left hand  All other systems reviewed and are negative.   Physical Exam Updated Vital Signs BP (!) 136/101   Pulse 83   Temp 97.8 F (36.6 C) (Oral)   Resp  (!) 24   SpO2 98%   Physical Exam Vitals and nursing note reviewed.  Constitutional:      General: He is in acute distress.     Appearance: Normal appearance.  HENT:     Head: Normocephalic and atraumatic.     Right Ear: External ear normal.     Left Ear: External ear normal.     Nose: Nose normal.     Mouth/Throat:     Mouth: Mucous membranes are moist.     Pharynx: Oropharynx is clear.  Eyes:     Extraocular Movements: Extraocular movements intact.     Conjunctiva/sclera: Conjunctivae normal.     Pupils: Pupils are equal, round, and reactive to light.  Cardiovascular:     Rate and Rhythm: Normal rate and regular rhythm.     Pulses: Normal pulses.     Heart sounds: Normal heart sounds.  Pulmonary:     Effort: Pulmonary effort is normal.     Breath sounds: Normal breath sounds.  Abdominal:     General: Abdomen is flat. Bowel sounds are normal.     Palpations: Abdomen is soft.  Musculoskeletal:       Arms:     Cervical back:  Normal range of motion and neck supple.     Comments: Deformity right elbow D/l left index finger  Skin:    General: Skin is warm.     Capillary Refill: Capillary refill takes less than 2 seconds.  Neurological:     General: No focal deficit present.     Mental Status: He is alert and oriented to person, place, and time.  Psychiatric:        Mood and Affect: Mood normal.        Behavior: Behavior normal.     ED Results / Procedures / Treatments   Labs (all labs ordered are listed, but only abnormal results are displayed) Labs Reviewed - No data to display  EKG None  Radiology DG Elbow 2 Views Right  Result Date: 06/26/2020 CLINICAL DATA:  59 year old male with arm pain after a fall EXAM: RIGHT ELBOW - 2 VIEW COMPARISON:  None. FINDINGS: Acute transverse fracture of the proximal radius at the head neck junction with mild displacement, 2-3 mm. Soft tissue swelling present. IMPRESSION: Acute proximal radius fracture at the head neck  junction with mild displacement. Electronically Signed   By: Gilmer Mor D.O.   On: 06/26/2020 14:35   DG Hand Complete Left  Result Date: 06/26/2020 CLINICAL DATA:  59 year old male with a fall EXAM: LEFT HAND - COMPLETE 3+ VIEW COMPARISON:  None FINDINGS: Dorsal subluxation/dislocation of the proximal interphalangeal joint of the second digit left hand. No significant degenerative changes. No acute fracture identified, although the PIP joint is not well evaluated. IMPRESSION: Dorsal subluxation/dislocation of the second digit PIP of the left hand. Electronically Signed   By: Gilmer Mor D.O.   On: 06/26/2020 14:37    Procedures Reduction of dislocation  Date/Time: 06/26/2020 3:36 PM Performed by: Jacalyn Lefevre, MD Authorized by: Jacalyn Lefevre, MD  Preparation: Patient was prepped and draped in the usual sterile fashion. Local anesthesia used: yes Anesthesia: digital block  Anesthesia: Local anesthesia used: yes Local Anesthetic: bupivacaine 0.5% without epinephrine Anesthetic total: 4 mL  Sedation: Patient sedated: no  Patient tolerance: patient tolerated the procedure well with no immediate complications Comments: Left index finger reduced    (including critical care time)  Medications Ordered in ED Medications  ondansetron (ZOFRAN) injection 4 mg (4 mg Intravenous Given 06/26/20 1411)  fentaNYL (SUBLIMAZE) injection 100 mcg (100 mcg Intravenous Given 06/26/20 1410)  bupivacaine (MARCAINE) 0.5 % (with pres) injection 50 mL (50 mLs Infiltration Given 06/26/20 1506)    ED Course  I have reviewed the triage vital signs and the nursing notes.  Pertinent labs & imaging results that were available during my care of the patient were reviewed by me and considered in my medical decision making (see chart for details).    MDM Rules/Calculators/A&P                          Pt d/w Dr. Jena Gauss (ortho).  He recommends a long arm splint to the right arm, CT elbow, and f/u  in his office.  Left index finger placed in a finger splint after reduction  Final Clinical Impression(s) / ED Diagnoses Final diagnoses:  Fall, initial encounter  Other closed fracture of proximal end of left radius, initial encounter  Dislocation of left index finger, initial encounter    Rx / DC Orders ED Discharge Orders         Ordered    ibuprofen (ADVIL) 600 MG tablet  Every 6 hours PRN  06/26/20 1539    HYDROcodone-acetaminophen (NORCO/VICODIN) 5-325 MG tablet  Every 4 hours PRN        06/26/20 1539           Jacalyn Lefevre, MD 06/26/20 1540

## 2020-06-26 NOTE — ED Notes (Signed)
Ortho at bedside.

## 2020-07-04 ENCOUNTER — Encounter (HOSPITAL_COMMUNITY): Payer: Self-pay | Admitting: Emergency Medicine

## 2020-07-04 ENCOUNTER — Emergency Department (HOSPITAL_COMMUNITY)
Admission: EM | Admit: 2020-07-04 | Discharge: 2020-07-04 | Disposition: A | Discharge status: Home / Self Care | Payer: Self-pay | Attending: Emergency Medicine | Admitting: Emergency Medicine

## 2020-07-04 ENCOUNTER — Other Ambulatory Visit: Payer: Self-pay

## 2020-07-04 DIAGNOSIS — Z464 Encounter for fitting and adjustment of orthodontic device: Secondary | ICD-10-CM | POA: Insufficient documentation

## 2020-07-04 DIAGNOSIS — S52101A Unspecified fracture of upper end of right radius, initial encounter for closed fracture: Secondary | ICD-10-CM

## 2020-07-04 DIAGNOSIS — W010XXA Fall on same level from slipping, tripping and stumbling without subsequent striking against object, initial encounter: Secondary | ICD-10-CM | POA: Insufficient documentation

## 2020-07-04 DIAGNOSIS — S52134A Nondisplaced fracture of neck of right radius, initial encounter for closed fracture: Secondary | ICD-10-CM | POA: Insufficient documentation

## 2020-07-04 MED ORDER — OXYCODONE-ACETAMINOPHEN 5-325 MG PO TABS
1.0000 | ORAL_TABLET | Freq: Once | ORAL | Status: AC
  Administered 2020-07-04: 1 via ORAL
  Filled 2020-07-04: qty 1

## 2020-07-04 MED ORDER — METHOCARBAMOL 500 MG PO TABS: 500.0000 mg | ORAL_TABLET | Freq: Two times a day (BID) | ORAL | 0 refills | Status: DC

## 2020-07-04 NOTE — ED Triage Notes (Signed)
Pt states he was seen in ED last Saturday for R elbow injury and was referred to orthopedist.  States he lost his job and is unable to pay to see specialist.  Reports continued pain to R elbow.  Pt arrived and came straight to triage.  Told him he was getting ready to go to treatment room and he states he needs to go outside and smoke first.

## 2020-07-04 NOTE — Discharge Instructions (Signed)
I have consulted the case manager/social worker for assistance with getting you into see the orthopedist. Take medications to help with your symptoms. Return to the ER if you start to experience numbness, increased swelling, additional injuries.

## 2020-07-04 NOTE — ED Provider Notes (Signed)
[facility] EMERGENCY DEPARTMENT Provider Note   CSN: [id number] Arrival date & time: 07/04/20  1136     History Chief Complaint  Patient presents with  . Arm Pain    Jose Taylor is a 59 y.o. male who presents to ED with a chief complaint of arm pain.  He was seen 1 week ago for fall and diagnosed with a fracture of the proximal radius at the head neck junction with mild displacement.  He was placed in a long-arm splint and sling, given pain medications and told to follow-up with orthopedist.  States that he called the orthopedist and is unable to afford the $150 co-pay to see them.  He tried removing the wrapping and is now concerned that the splint may not be in its proper positioning.  He reports ongoing pain despite taking the pain medicine.  Denies any additional injuries.  No numbness or weakness noted.  HPI     History reviewed. No pertinent past medical history.  There are no problems to display for this patient.   Past Surgical History:  Procedure Laterality Date  . Artificial Hip    . ELBOW SURGERY         No family history on file.  Social History   Tobacco Use  . Smoking status: Current Every Day Smoker    Packs/day: 0.20    Types: Cigarettes  . Smokeless tobacco: Never Used  Substance Use Topics  . Alcohol use: Yes    Comment: 3 beers per day  . Drug use: Yes    Types: Marijuana    Comment: "occasional pot"    Home Medications Prior to Admission medications   Medication Sig Start Date End Date Taking? Authorizing Provider  fluticasone (FLONASE) 50 MCG/ACT nasal spray Place 2 sprays into both nostrils daily. 08/18/19   Cathie Hoops, Amy V, PA-C  HYDROcodone-acetaminophen (NORCO/VICODIN) 5-325 MG tablet Take 1 tablet by mouth every 4 (four) hours as needed. 06/26/20   Jacalyn Lefevre, MD  ibuprofen (ADVIL) 600 MG tablet Take 1 tablet (600 mg total) by mouth every 6 (six) hours as needed. 06/26/20   Jacalyn Lefevre, MD  methocarbamol  (ROBAXIN) 500 MG tablet Take 1 tablet (500 mg total) by mouth 2 (two) times daily. 07/04/20   Nattie Lazenby, PA-C    Allergies    Gadolinium derivatives, Morphine and related, and Penicillins  Review of Systems   Review of Systems  Constitutional: Negative for chills and fever.  Musculoskeletal: Positive for myalgias.  Neurological: Negative for weakness and numbness.    Physical Exam Updated Vital Signs BP 132/86 (BP Location: Left Arm)   Pulse 82   Temp 98 F (36.7 C) (Oral)   Resp 16   SpO2 98%   Physical Exam Vitals and nursing note reviewed.  Constitutional:      General: He is not in acute distress.    Appearance: He is well-developed. He is not diaphoretic.  HENT:     Head: Normocephalic and atraumatic.  Eyes:     General: No scleral icterus.    Conjunctiva/sclera: Conjunctivae normal.  Pulmonary:     Effort: Pulmonary effort is normal. No respiratory distress.  Musculoskeletal:     Cervical back: Normal range of motion.     Comments: Long arm splint in place right upper extremity.  2+ radial pulse palpated. Normal sensation to light touch.  Skin:    Findings: No rash.  Neurological:     Mental Status: He is alert.  ED Results / Procedures / Treatments   Labs (all labs ordered are listed, but only abnormal results are displayed) Labs Reviewed - No data to display  EKG None  Radiology No results found.  Procedures Procedures (including critical care time)  Medications Ordered in ED Medications  oxyCODONE-acetaminophen (PERCOCET/ROXICET) 5-325 MG per tablet 1 tablet (1 tablet Oral Given 07/04/20 1225)    ED Course  I have reviewed the triage vital signs and the nursing notes.  Pertinent labs & imaging results that were available during my care of the patient were reviewed by me and considered in my medical decision making (see chart for details).    MDM Rules/Calculators/A&P                          59 year old male seen and evaluated last  week after fall and diagnosed with proximal radius fracture at the head neck junction placed in long-arm splint and sling presenting to the ED for continued right arm pain.  States that he remove the wrapping from his splint and is concerned that it may have moved it.  He is unable to afford follow-up with orthopedist.  States that his pain medications are not working.  Although he did finish all of them.  On exam he has intact distal pulses of his right upper extremity.  Normal sensation.  He denies any additional injuries.  His splint does appear to be adjusted.  Orthotec in with patient and fixed positioning.  Patient states that he feels much better.  Will give muscle relaxer to help with pain and spasms.  I have placed a transitions of care consult for assistance with Ortho follow-up.   Patient is hemodynamically stable, in NAD, and able to ambulate in the ED. Evaluation does not show pathology that would require ongoing emergent intervention or inpatient treatment. I explained the diagnosis to the patient. Pain has been managed and has no complaints prior to discharge. Patient is comfortable with above plan and is stable for discharge at this time. All questions were answered prior to disposition. Strict return precautions for returning to the ED were discussed. Encouraged follow up with PCP.   An After Visit Summary was printed and given to the patient.   Portions of this note were generated with Scientist, clinical (histocompatibility and immunogenetics). Dictation errors may occur despite best attempts at proofreading.  Final Clinical Impression(s) / ED Diagnoses Final diagnoses:  Closed fracture of proximal end of right radius, unspecified fracture morphology, initial encounter    Rx / DC Orders ED Discharge Orders         Ordered    methocarbamol (ROBAXIN) 500 MG tablet  2 times daily        07/04/20 1304           Dietrich Pates, PA-C 07/04/20 1304    Wynetta Fines, MD 07/06/20 [phone]

## 2020-07-04 NOTE — Progress Notes (Signed)
Orthopedic Tech Progress Note Patient Details:  Jose Taylor [birth year] [id number] Was called by RN to come and reapply patient splint. Added more padding to patient splint Ortho Devices Type of Ortho Device: Long arm splint Ortho Device/Splint Location: RUE Ortho Device/Splint Interventions: Application, Adjustment   Post Interventions Patient Tolerated: Well Instructions Provided: Care of device   Donald Pore 07/04/2020, 12:54 PM

## 2020-07-04 NOTE — Care Management (Signed)
Patient expressing concern at paying for medications just recently started job, now cannot work due to injury, however has a friend that states that he will take care of things for him not to worry. Good rx given to patient. Told him script was sent to pharmacy of choice that is listed in chart. He will get all documentation at discharge. He asked about ortho care assistance. Placed PCP [facility] on chart. He can make appt there then be referred out.

## 2020-07-06 ENCOUNTER — Telehealth: Payer: Self-pay | Admitting: *Deleted

## 2020-07-06 NOTE — Telephone Encounter (Signed)
Pt called regarding a number of an office accepting uninsured  Patients that he could follow-up with.  RNCM provided [facility] [phone].

## 2020-09-30 ENCOUNTER — Emergency Department (HOSPITAL_COMMUNITY)
Admission: EM | Admit: 2020-09-30 | Discharge: 2020-10-01 | Disposition: A | Discharge status: Home / Self Care | Payer: Self-pay | Attending: Emergency Medicine | Admitting: Emergency Medicine

## 2020-09-30 ENCOUNTER — Other Ambulatory Visit: Payer: Self-pay

## 2020-09-30 ENCOUNTER — Encounter (HOSPITAL_COMMUNITY): Payer: Self-pay | Admitting: Emergency Medicine

## 2020-09-30 DIAGNOSIS — N342 Other urethritis: Secondary | ICD-10-CM

## 2020-09-30 DIAGNOSIS — F17210 Nicotine dependence, cigarettes, uncomplicated: Secondary | ICD-10-CM | POA: Insufficient documentation

## 2020-09-30 DIAGNOSIS — N341 Nonspecific urethritis: Secondary | ICD-10-CM | POA: Insufficient documentation

## 2020-09-30 NOTE — ED Triage Notes (Signed)
Patient requesting STD screening , reports penile discharge " I'm leaking" onset Monday morning , no dysuria or fever , patient added pain during erection .

## 2020-10-01 LAB — GC/CHLAMYDIA PROBE AMP (~~LOC~~) NOT AT ARMC
Chlamydia: POSITIVE — AB
Comment: NEGATIVE
Comment: NORMAL
Neisseria Gonorrhea: POSITIVE — AB

## 2020-10-01 LAB — URINALYSIS, ROUTINE W REFLEX MICROSCOPIC
Bilirubin Urine: NEGATIVE
Glucose, UA: NEGATIVE mg/dL
Hgb urine dipstick: NEGATIVE
Ketones, ur: 20 mg/dL — AB
Nitrite: NEGATIVE
Protein, ur: NEGATIVE mg/dL
Specific Gravity, Urine: 1.027 (ref 1.005–1.030)
WBC, UA: >50 WBC/hpf — ABNORMAL HIGH (ref 0–5)
pH: 5 (ref 5.0–8.0)

## 2020-10-01 MED ORDER — DIPHENHYDRAMINE HCL 25 MG PO CAPS
25.0000 mg | ORAL_CAPSULE | Freq: Once | ORAL | Status: AC
  Administered 2020-10-01: 25 mg via ORAL
  Filled 2020-10-01: qty 1

## 2020-10-01 MED ORDER — METRONIDAZOLE 500 MG PO TABS
2000.0000 mg | ORAL_TABLET | Freq: Once | ORAL | Status: AC
  Administered 2020-10-01: 2000 mg via ORAL
  Filled 2020-10-01: qty 4

## 2020-10-01 MED ORDER — DOXYCYCLINE HYCLATE 100 MG PO TABS
100.0000 mg | ORAL_TABLET | Freq: Once | ORAL | Status: AC
  Administered 2020-10-01: 100 mg via ORAL
  Filled 2020-10-01: qty 1

## 2020-10-01 MED ORDER — CEFTRIAXONE SODIUM 500 MG IJ SOLR
500.0000 mg | Freq: Once | INTRAMUSCULAR | Status: AC
  Administered 2020-10-01: 500 mg via INTRAMUSCULAR
  Filled 2020-10-01: qty 500

## 2020-10-01 MED ORDER — DOXYCYCLINE HYCLATE 100 MG PO CAPS: 100.0000 mg | ORAL_CAPSULE | Freq: Two times a day (BID) | ORAL | 0 refills | Status: DC

## 2020-10-01 NOTE — ED Provider Notes (Signed)
[facility] EMERGENCY DEPARTMENT Provider Note  CSN: [id number] Arrival date & time: 09/30/20 1900  Chief Complaint(s) Penile Discharge  HPI Jose Taylor is a 60 y.o. male here for 3 days of penile discharge and dysuria.  Believes he has got an STD.  Reports being sexually active with a male friend whom he has occasional sex with.  Reports prior STD a few years ago with the same partner.  No alleviating or aggravating factors.  No testicular pain or swelling.  No nausea or vomiting.  No fevers or chills.  No myalgias.  No other physical complaints.  HPI  Past Medical History History reviewed. No pertinent past medical history. There are no problems to display for this patient.  Home Medication(s) Prior to Admission medications   Medication Sig Start Date End Date Taking? Authorizing Provider  doxycycline (VIBRAMYCIN) 100 MG capsule Take 1 capsule (100 mg total) by mouth 2 (two) times daily. 10/01/20  Yes Clorinda Wyble, Amadeo Garnet, MD  fluticasone [facility]) 50 MCG/ACT nasal spray Place 2 sprays into both nostrils daily. 08/18/19   Cathie Hoops, Amy V, PA-C  HYDROcodone-acetaminophen (NORCO/VICODIN) 5-325 MG tablet Take 1 tablet by mouth every 4 (four) hours as needed. 06/26/20   Jacalyn Lefevre, MD  ibuprofen (ADVIL) 600 MG tablet Take 1 tablet (600 mg total) by mouth every 6 (six) hours as needed. 06/26/20   Jacalyn Lefevre, MD  methocarbamol (ROBAXIN) 500 MG tablet Take 1 tablet (500 mg total) by mouth 2 (two) times daily. 07/04/20   Dietrich Pates, PA-C                                                                                                                                    Past Surgical History Past Surgical History:  Procedure Laterality Date  . Artificial Hip    . ELBOW SURGERY     Family History No family history on file.  Social History Social History   Tobacco Use  . Smoking status: Current Every Day Smoker    Packs/day: 0.20    Types: Cigarettes  .  Smokeless tobacco: Never Used  Substance Use Topics  . Alcohol use: Yes    Comment: 3 beers per day  . Drug use: Yes    Types: Marijuana    Comment: "occasional pot"   Allergies Gadolinium derivatives, Morphine and related, and Penicillins  Review of Systems Review of Systems All other systems are reviewed and are negative for acute change except as noted in the HPI  Physical Exam Vital Signs  I have reviewed the triage vital signs BP 112/71 (BP Location: Right Arm)   Pulse 92   Temp 99 F (37.2 C) (Oral)   Resp 17   Ht 5\' 7"  (1.702 m)   Wt 75 kg   SpO2 98%   BMI 25.90 kg/m   Physical Exam Vitals reviewed.  Constitutional:      General: He is not in  acute distress.    Appearance: He is well-developed and well-nourished. He is not diaphoretic.  HENT:     Head: Normocephalic and atraumatic.     Jaw: No trismus.     Right Ear: External ear normal.     Left Ear: External ear normal.     Nose: Nose normal.     Mouth/Throat:     Mouth: Mucous membranes are normal.  Eyes:     General: No scleral icterus.    Extraocular Movements: EOM normal.     Conjunctiva/sclera: Conjunctivae normal.  Neck:     Trachea: Phonation normal.  Cardiovascular:     Rate and Rhythm: Normal rate and regular rhythm.  Pulmonary:     Effort: Pulmonary effort is normal. No respiratory distress.     Breath sounds: No stridor.  Abdominal:     General: There is no distension.  Genitourinary:    Penis: Circumcised. Erythema and discharge present. No swelling.      Testes: Normal.        Right: Tenderness or swelling not present.        Left: Tenderness or swelling not present.  Musculoskeletal:        General: No edema. Normal range of motion.     Cervical back: Normal range of motion.  Neurological:     Mental Status: He is alert and oriented to person, place, and time.  Psychiatric:        Mood and Affect: Mood and affect normal.        Behavior: Behavior normal.     ED Results and  Treatments Labs (all labs ordered are listed, but only abnormal results are displayed) Labs Reviewed  URINALYSIS, ROUTINE W REFLEX MICROSCOPIC - Abnormal; Notable for the following components:      Result Value   APPearance HAZY (*)    Ketones, ur 20 (*)    Leukocytes,Ua LARGE (*)    WBC, UA >50 (*)    Bacteria, UA RARE (*)    All other components within normal limits  URINE CULTURE  GC/CHLAMYDIA PROBE AMP ([place]) NOT AT [facility]                                                                                                                         EKG  EKG Interpretation  Date/Time:    Ventricular Rate:    PR Interval:    QRS Duration:   QT Interval:    QTC Calculation:   R Axis:     Text Interpretation:        Radiology No results found.  Pertinent labs & imaging results that were available during my care of the patient were reviewed by me and considered in my medical decision making (see chart for details).  Medications Ordered in ED Medications  cefTRIAXone (ROCEPHIN) injection 500 mg (has no administration in time range)  diphenhydrAMINE (BENADRYL) capsule 25 mg (has no administration in time range)  metroNIDAZOLE (FLAGYL) tablet 2,000 mg (  has no administration in time range)  doxycycline (VIBRA-TABS) tablet 100 mg (has no administration in time range)                                                                                                                                    Procedures Procedures  (including critical care time)  Medical Decision Making / ED Course I have reviewed the nursing notes for this encounter and the patient's prior records (if available in EHR or on provided paperwork).   Jose Taylor was evaluated in Emergency Department on 10/01/2020 for the symptoms described in the history of present illness. He was evaluated in the context of the global COVID-19 pandemic, which necessitated consideration that the patient might be at  risk for infection with the SARS-CoV-2 virus that causes COVID-19. Institutional protocols and algorithms that pertain to the evaluation of patients at risk for COVID-19 are in a state of rapid change based on information released by regulatory bodies including the CDC and federal and state organizations. These policies and algorithms were followed during the patient's care in the ED.  Patient presents with penile discharge and dysuria. On exam patient does have evidence of urethritis. GC/chlamydia urine sample collected and sent. UA suspicious for infection.  Clinically STI. Will treat empirically for GC/chlamydia and trichomonas.       Final Clinical Impression(s) / ED Diagnoses Final diagnoses:  Urethritis    The patient appears reasonably screened and/or stabilized for discharge and I doubt any other medical condition or other [facility] requiring further screening, evaluation, or treatment in the ED at this time prior to discharge. Safe for discharge with strict return precautions.  Disposition: Discharge  Condition: Good  I have discussed the results, Dx and Tx plan with the patient/family who expressed understanding and agree(s) with the plan. Discharge instructions discussed at length. The patient/family was given strict return precautions who verbalized understanding of the instructions. No further questions at time of discharge.    ED Discharge Orders         Ordered    doxycycline (VIBRAMYCIN) 100 MG capsule  2 times daily        10/01/20 0511           Follow Up: Primary care provider  Call  As needed     This chart was dictated using voice recognition software.  Despite best efforts to proofread,  errors can occur which can change the documentation meaning.   Nira Conn, MD 10/01/20 [phone]

## 2020-10-02 LAB — URINE CULTURE: Culture: NO GROWTH

## 2020-12-24 ENCOUNTER — Encounter (HOSPITAL_COMMUNITY): Payer: Self-pay

## 2020-12-24 ENCOUNTER — Other Ambulatory Visit: Payer: Self-pay

## 2020-12-24 ENCOUNTER — Ambulatory Visit (HOSPITAL_COMMUNITY)
Admission: EM | Admit: 2020-12-24 | Discharge: 2020-12-24 | Disposition: A | Discharge status: Home / Self Care | Payer: Self-pay | Attending: Urgent Care | Admitting: Urgent Care

## 2020-12-24 DIAGNOSIS — R300 Dysuria: Secondary | ICD-10-CM | POA: Insufficient documentation

## 2020-12-24 DIAGNOSIS — Z7251 High risk heterosexual behavior: Secondary | ICD-10-CM | POA: Insufficient documentation

## 2020-12-24 DIAGNOSIS — R369 Urethral discharge, unspecified: Secondary | ICD-10-CM | POA: Insufficient documentation

## 2020-12-24 DIAGNOSIS — A749 Chlamydial infection, unspecified: Secondary | ICD-10-CM | POA: Insufficient documentation

## 2020-12-24 DIAGNOSIS — A549 Gonococcal infection, unspecified: Secondary | ICD-10-CM | POA: Insufficient documentation

## 2020-12-24 LAB — POCT URINALYSIS DIPSTICK, ED / UC
Bilirubin Urine: NEGATIVE
Glucose, UA: NEGATIVE mg/dL
Ketones, ur: NEGATIVE mg/dL
Nitrite: NEGATIVE
Protein, ur: NEGATIVE mg/dL
Specific Gravity, Urine: 1.025 (ref 1.005–1.030)
Urobilinogen, UA: 1 mg/dL (ref 0.0–1.0)
pH: 6 (ref 5.0–8.0)

## 2020-12-24 MED ORDER — DOXYCYCLINE HYCLATE 100 MG PO CAPS: 100.0000 mg | ORAL_CAPSULE | Freq: Two times a day (BID) | ORAL | 0 refills | Status: DC

## 2020-12-24 NOTE — Discharge Instructions (Signed)
Avoid all forms of sexual intercourse (oral, vaginal, anal) for the next 7 days to avoid spreading/reinfecting. Return if symptoms worsen/do not resolve, you develop fever, abdominal pain, blood in your urine, or are re-exposed to an STI.  

## 2020-12-24 NOTE — ED Provider Notes (Addendum)
[facility] - [facility]RG[facility]   MRN: [id number] DOB: 10/09/1960  Subjective:   Jose Taylor is a 60 y.o. male presenting for 3 day history of acute onset dysuria, penile discharge. Denies hematuria, urinary frequency, penile swelling, testicular pain, testicular swelling, anal pain, groin pain. Had unprotected sex with 1 new male partner. Of note, he did test positive for gonorrhea and chlamydia 09/30/2020.   No current facility-administered medications for this encounter.  Current Outpatient Medications:  .  doxycycline (VIBRAMYCIN) 100 MG capsule, Take 1 capsule (100 mg total) by mouth 2 (two) times daily., Disp: 20 capsule, Rfl: 0 .  fluticasone (FLONASE) 50 MCG/ACT nasal spray, Place 2 sprays into both nostrils daily., Disp: 1 g, Rfl: 0 .  HYDROcodone-acetaminophen (NORCO/VICODIN) 5-325 MG tablet, Take 1 tablet by mouth every 4 (four) hours as needed., Disp: 10 tablet, Rfl: 0 .  ibuprofen (ADVIL) 600 MG tablet, Take 1 tablet (600 mg total) by mouth every 6 (six) hours as needed., Disp: 30 tablet, Rfl: 0 .  methocarbamol (ROBAXIN) 500 MG tablet, Take 1 tablet (500 mg total) by mouth 2 (two) times daily., Disp: 20 tablet, Rfl: 0   Allergies  Allergen Reactions  . Gadolinium Derivatives Nausea And Vomiting  . Morphine And Related     itching  . Penicillins Itching    Has patient had a PCN reaction causing immediate rash, facial/tongue/throat swelling, SOB or lightheadedness with hypotension: No Has patient had a PCN reaction causing severe rash involving mucus membranes or skin necrosis: No Has patient had a PCN reaction that required hospitalization No Has patient had a PCN reaction occurring within the last 10 years: No If all of the above answers are "NO", then may proceed with Cephalosporin use.     History reviewed. No pertinent past medical history.   Past Surgical History:  Procedure Laterality Date  . Artificial Hip    . ELBOW SURGERY      Family History   Problem Relation Age of Onset  . Stroke Mother   . Diabetes Mother   . Dementia Father     Social History   Tobacco Use  . Smoking status: Current Every Day Smoker    Packs/day: 0.20    Types: Cigarettes  . Smokeless tobacco: Never Used  Substance Use Topics  . Alcohol use: Yes    Comment: 3 beers per day  . Drug use: Yes    Types: Marijuana    Comment: "occasional pot"    ROS   Objective:   Vitals: BP 116/84 (BP Location: Right Arm)   Pulse 73   Temp 98.5 F (36.9 C) (Oral)   Resp 16   SpO2 99%   Physical Exam Constitutional:      General: He is not in acute distress.    Appearance: Normal appearance. He is well-developed and normal weight. He is not ill-appearing, toxic-appearing or diaphoretic.  HENT:     Head: Normocephalic and atraumatic.     Right Ear: External ear normal.     Left Ear: External ear normal.     Nose: Nose normal.     Mouth/Throat:     Pharynx: Oropharynx is clear.  Eyes:     General: No scleral icterus.       Right eye: No discharge.        Left eye: No discharge.     Extraocular Movements: Extraocular movements intact.     Pupils: Pupils are equal, round, and reactive to light.  Cardiovascular:  Rate and Rhythm: Normal rate.  Pulmonary:     Effort: Pulmonary effort is normal.  Genitourinary:    Penis: Uncircumcised. Discharge present. No phimosis, paraphimosis, hypospadias, erythema, tenderness, swelling or lesions.   Musculoskeletal:     Cervical back: Normal range of motion.  Skin:    General: Skin is warm and dry.  Neurological:     Mental Status: He is alert and oriented to person, place, and time.  Psychiatric:        Mood and Affect: Mood normal.        Behavior: Behavior normal.        Thought Content: Thought content normal.        Judgment: Judgment normal.     Results for orders placed or performed during the hospital encounter of 12/24/20 (from the past 24 hour(s))  POC Urinalysis dipstick     Status:  Abnormal   Collection Time: 12/24/20  2:34 PM  Result Value Ref Range   Glucose, UA NEGATIVE NEGATIVE mg/dL   Bilirubin Urine NEGATIVE NEGATIVE   Ketones, ur NEGATIVE NEGATIVE mg/dL   Specific Gravity, Urine 1.025 1.005 - 1.030   Hgb urine dipstick SMALL (A) NEGATIVE   pH 6.0 5.0 - 8.0   Protein, ur NEGATIVE NEGATIVE mg/dL   Urobilinogen, UA 1.0 0.0 - 1.0 mg/dL   Nitrite NEGATIVE NEGATIVE   Leukocytes,Ua LARGE (A) NEGATIVE    Assessment and Plan :   PDMP not reviewed this encounter.  1. Chlamydia   2. Dysuria   3. Penile discharge   4. Unprotected sex   5. Gonorrhea     Medical records reviewed from the emergency department. Patient was not aware of his test results from 09/2020, was positive for gonorrhea and chlamydia. He did receive an IM ceftriaxone at that visit in the emergency room for empiric treatment. Patient did not pick up doxycycline for his treatment. Needs to do that today, verbalized understanding. Labs pending, will treat as appropriate. Counseled patient on potential for adverse effects with medications prescribed/recommended today, ER and return-to-clinic precautions discussed, patient verbalized understanding.     Wallis Bamberg, [place] 12/24/20 1445

## 2020-12-24 NOTE — ED Triage Notes (Signed)
Patient presents to Urgent Care with complaints of sting sensation and discharge x 3 days ago. Pt states he had unprotected sex so here to rule out STD.   Denies fever or abdominal pain.

## 2020-12-26 LAB — URINE CULTURE: Culture: NO GROWTH

## 2020-12-27 LAB — CYTOLOGY, (ORAL, ANAL, URETHRAL) ANCILLARY ONLY
Chlamydia: NEGATIVE
Comment: NEGATIVE
Comment: NEGATIVE
Comment: NORMAL
Neisseria Gonorrhea: POSITIVE — AB
Trichomonas: NEGATIVE

## 2020-12-28 ENCOUNTER — Telehealth (HOSPITAL_COMMUNITY): Payer: Self-pay | Admitting: Emergency Medicine

## 2020-12-28 NOTE — Telephone Encounter (Signed)
Per protocol,p atient will need treatment with IM Rocephin 500mg  for positive Gonorrhea.  Attempted to reach patient x 1, LVM

## 2022-02-15 ENCOUNTER — Emergency Department (HOSPITAL_COMMUNITY): Payer: Commercial Managed Care - HMO

## 2022-02-15 ENCOUNTER — Emergency Department (HOSPITAL_COMMUNITY)
Admission: EM | Admit: 2022-02-15 | Discharge: 2022-02-15 | Disposition: A | Discharge status: Home / Self Care | Payer: Commercial Managed Care - HMO | Attending: Emergency Medicine | Admitting: Emergency Medicine

## 2022-02-15 ENCOUNTER — Encounter (HOSPITAL_COMMUNITY): Payer: Self-pay

## 2022-02-15 ENCOUNTER — Other Ambulatory Visit: Payer: Self-pay

## 2022-02-15 DIAGNOSIS — H6121 Impacted cerumen, right ear: Secondary | ICD-10-CM | POA: Diagnosis not present

## 2022-02-15 DIAGNOSIS — R519 Headache, unspecified: Secondary | ICD-10-CM | POA: Insufficient documentation

## 2022-02-15 DIAGNOSIS — R42 Dizziness and giddiness: Secondary | ICD-10-CM | POA: Diagnosis present

## 2022-02-15 LAB — URINALYSIS, ROUTINE W REFLEX MICROSCOPIC
Bacteria, UA: NONE SEEN
Bilirubin Urine: NEGATIVE
Glucose, UA: NEGATIVE mg/dL
Hgb urine dipstick: NEGATIVE
Ketones, ur: NEGATIVE mg/dL
Nitrite: NEGATIVE
Protein, ur: NEGATIVE mg/dL
Specific Gravity, Urine: 1.027 (ref 1.005–1.030)
pH: 5 (ref 5.0–8.0)

## 2022-02-15 LAB — CBC
HCT: 43.5 % (ref 39.0–52.0)
Hemoglobin: 14.4 g/dL (ref 13.0–17.0)
MCH: 30.8 pg (ref 26.0–34.0)
MCHC: 33.1 g/dL (ref 30.0–36.0)
MCV: 93.1 fL (ref 80.0–100.0)
Platelets: 261 10*3/uL (ref 150–400)
RBC: 4.67 MIL/uL (ref 4.22–5.81)
RDW: 13.2 % (ref 11.5–15.5)
WBC: 7.8 10*3/uL (ref 4.0–10.5)
nRBC: 0 % (ref 0.0–0.2)

## 2022-02-15 LAB — BASIC METABOLIC PANEL
Anion gap: 8 (ref 5–15)
BUN: 13 mg/dL (ref 6–20)
CO2: 28 mmol/L (ref 22–32)
Calcium: 9.3 mg/dL (ref 8.9–10.3)
Chloride: 105 mmol/L (ref 98–111)
Creatinine, Ser: 0.75 mg/dL (ref 0.61–1.24)
GFR, Estimated: >60 mL/min (ref >60)
Glucose, Bld: 105 mg/dL — ABNORMAL HIGH (ref 70–99)
Potassium: 3.7 mmol/L (ref 3.5–5.1)
Sodium: 141 mmol/L (ref 135–145)

## 2022-02-15 LAB — CBG MONITORING, ED: Glucose-Capillary: 134 mg/dL — ABNORMAL HIGH (ref 70–99)

## 2022-02-15 MED ORDER — MECLIZINE HCL 25 MG PO TABS
50.0000 mg | ORAL_TABLET | Freq: Once | ORAL | Status: AC
  Administered 2022-02-15: 50 mg via ORAL
  Filled 2022-02-15: qty 2

## 2022-02-15 MED ORDER — MECLIZINE HCL 25 MG PO TABS: 25.0000 mg | ORAL_TABLET | Freq: Three times a day (TID) | ORAL | 0 refills | Status: DC | PRN

## 2022-02-15 NOTE — ED Triage Notes (Signed)
Pt reports dizziness and headaches x1 week. Denies chest pain, SHOB. Denies hx of same.

## 2022-02-15 NOTE — ED Notes (Signed)
An After Visit Summary was printed and given to the patient. Discharge instructions given and no further questions at this time.  

## 2022-02-15 NOTE — ED Provider Triage Note (Signed)
Emergency Medicine Provider Triage Evaluation Note  Jose Taylor , a 60 y.o. male  was evaluated in triage.  Patient denies significant past medical history.  Pt complains of vertigo and headache.  Symptoms started about a week ago.  He has had a mild pressure in the left side of the head.  He has a spinning sensation when he moves or sits up.  Yesterday he fell when he got up too fast in a hotel room.  Patient denies signs of stroke including: facial droop, slurred speech, aphasia, weakness/numbness in extremities, imbalance/trouble walking.   Review of Systems  Positive: Vertigo Negative: Weakness  Physical Exam  BP 109/74 (BP Location: Left Arm)   Pulse 74   Temp 98.2 F (36.8 C) (Oral)   Resp 18   SpO2 97%  Gen:   Awake, no distress   Resp:  Normal effort  MSK:   Moves extremities without difficulty  Other:  Normal gross neuro exam, few beats of lateral nystagmus with leftward gaze which resolves after couple seconds  Medical Decision Making  Medically screening exam initiated at 1:09 PM.  Appropriate orders placed.  Nollie Terlizzi was informed that the remainder of the evaluation will be completed by another provider, this initial triage assessment does not replace that evaluation, and the importance of remaining in the ED until their evaluation is complete.     Renne Crigler, PA-C 02/15/22 1310

## 2022-02-15 NOTE — ED Provider Notes (Addendum)
[place] DEPT Provider Note   CSN: [id number] Arrival date & time: 02/15/22  1245     History  Chief Complaint  Patient presents with   Dizziness    Jose Taylor is a 61 y.o. male.  Patient presented to the hospital complaining of 1 week of dizziness and 2 days of intermittent posterior headache.  Patient states that when he first stands up he feels the room is spinning around him.  When he moves back to a sitting position it improves.  The patient states that lying down intermittently seems to cause the issue.  The patient thinks he took a medication at home for the symptoms that started with the letter "M" but he is unsure. The headache has been mild, dull, and intermittent in nature. Currently complains of no headache.  Patient denies any recent illness, neurologic symptoms, fevers, trauma.  Endorses dizziness and headache. Patient has no relevant past medical history.   HPI     Home Medications Prior to Admission medications   Medication Sig Start Date End Date Taking? Authorizing Provider  meclizine (ANTIVERT) 25 MG tablet Take 1 tablet (25 mg total) by mouth 3 (three) times daily as needed for dizziness. 02/15/22  Yes Dorothyann Peng, PA-C  fluticasone (FLONASE) 50 MCG/ACT nasal spray Place 2 sprays into both nostrils daily. 08/18/19 12/24/20  Ok Edwards, PA-C      Allergies    Gadolinium derivatives, Morphine and related, and Penicillins    Review of Systems   Review of Systems  Constitutional:  Negative for fever.  HENT:  Negative for ear pain, hearing loss, sinus pressure and tinnitus.   Respiratory:  Negative for shortness of breath.   Cardiovascular:  Negative for chest pain.  Gastrointestinal:  Negative for abdominal pain, nausea and vomiting.  Neurological:  Positive for dizziness and headaches. Negative for syncope and light-headedness.    Physical Exam Updated Vital Signs BP 127/74   Pulse (!) 58   Temp 98.2 F (36.8  C) (Oral)   Resp 18   SpO2 100%  Physical Exam Vitals and nursing note reviewed.  Constitutional:      General: He is not in acute distress. HENT:     Head: Normocephalic and atraumatic.     Right Ear: There is impacted cerumen.     Left Ear: Tympanic membrane normal.     Mouth/Throat:     Mouth: Mucous membranes are moist.  Eyes:     Extraocular Movements: Extraocular movements intact.     Conjunctiva/sclera: Conjunctivae normal.     Pupils: Pupils are equal, round, and reactive to light.  Cardiovascular:     Rate and Rhythm: Normal rate and regular rhythm.     Heart sounds: Normal heart sounds.  Pulmonary:     Effort: Pulmonary effort is normal.     Breath sounds: Normal breath sounds.  Musculoskeletal:        General: Normal range of motion.     Cervical back: Normal range of motion and neck supple.  Skin:    General: Skin is warm and dry.     Capillary Refill: Capillary refill takes less than 2 seconds.  Neurological:     Mental Status: He is alert.     Sensory: Sensation is intact.     Motor: Motor function is intact.     Coordination: Heel to Shin Test normal.     Comments: CN II-VII, XI, XII grossly intact Gait intact  ED Results / Procedures / Treatments   Labs (all labs ordered are listed, but only abnormal results are displayed) Labs Reviewed  BASIC METABOLIC PANEL - Abnormal; Notable for the following components:      Result Value   Glucose, Bld 105 (*)    All other components within normal limits  URINALYSIS, ROUTINE W REFLEX MICROSCOPIC - Abnormal; Notable for the following components:   Leukocytes,Ua SMALL (*)    All other components within normal limits  CBG MONITORING, ED - Abnormal; Notable for the following components:   Glucose-Capillary 134 (*)    All other components within normal limits  CBC    EKG EKG Interpretation  Date/Time:  Wednesday February 15 2022 13:01:36 EDT Ventricular Rate:  78 PR Interval:  194 QRS Duration: 91 QT  Interval:  363 QTC Calculation: 414 R Axis:   67 Text Interpretation: Sinus rhythm Minimal ST elevation, anterior leads No acute changes No significant change since last tracing Confirmed by Derwood Kaplan [phone]) on 02/15/2022 2:27:22 PM  Radiology CT HEAD WO CONTRAST ( )  Result Date: 02/15/2022 CLINICAL DATA:  Headache, new or worsening.  Vertigo. EXAM: CT HEAD WITHOUT CONTRAST TECHNIQUE: Contiguous axial images were obtained from the base of the skull through the vertex without intravenous contrast. RADIATION DOSE REDUCTION: This exam was performed according to the departmental dose-optimization program which includes automated exposure control, adjustment of the mA and/or kV according to patient size and/or use of iterative reconstruction technique. COMPARISON:  CT head dated Jan 22, 2019 FINDINGS: Brain: No evidence of acute infarction, hemorrhage, hydrocephalus, extra-axial collection or mass lesion/mass effect. Vascular: No hyperdense vessel or unexpected calcification. Skull: Normal. Negative for fracture or focal lesion. Sinuses/Orbits: No acute finding. Other: None. IMPRESSION: No acute intracranial abnormality. Electronically Signed   By: Larose Hires D.O.   On: 02/15/2022 13:25    Procedures .Ear Cerumen Removal  Date/Time: 02/15/2022 3:16 PM  Performed by: Darrick Grinder, PA-C Authorized by: Darrick Grinder, PA-C   Consent:    Consent obtained:  Verbal   Consent given by:  Patient   Risks, benefits, and alternatives were discussed: yes     Risks discussed:  Pain and TM perforation   Alternatives discussed:  No treatment Universal protocol:    Procedure explained and questions answered to patient or proxy's satisfaction: yes     Patient identity confirmed:  Verbally with patient Procedure details:    Location:  L ear and R ear   Procedure type: curette     Procedure outcomes: cerumen removed   Post-procedure details:    Inspection:  Ear canal clear, no bleeding and  TM intact   Hearing quality:  Improved   Procedure completion:  Tolerated well, no immediate complications     Medications Ordered in ED Medications  meclizine (ANTIVERT) tablet 50 mg (50 mg Oral Given 02/15/22 1447)    ED Course/ Medical Decision Making/ A&P                           Medical Decision Making Amount and/or Complexity of Data Reviewed Labs: ordered. Radiology: ordered.   Patient presents to the hospital complaining of vertiginous-like symptoms.  Differential includes BPPV, vestibular neuritis, cerebellar stroke, and others  I ordered and interpreted labs.  Relevant results include CBG 134, grossly normal CBC, small leukocytes on urinalysis  I ordered and personally interpreted imaging including CT head without contrast.  Results include no acute intracranial abnormality.  I  agree with the radiologist findings.  I ordered Antivert for the patient for his vertiginous symptoms.  Upon reassessment the patient had improved.  The patient was ears were cleaned.  Upon flushing the patient experienced dizziness.  The patient's TM's are normal bilaterally. No signs of otitis media or vestibular neuritis. No signs of labyrinthitis at this time. No stroke symptoms. No dysarthria, dystaxia, diplopia, dysphagia.  This is likely BPPV. The patient's sister has frequent episodes of vertigo. At this time I'm comfortable discharging the patient with a prescription for antivert and follow up with neurology. If the patient develops any stroke warning signs he will immediately return           Final Clinical Impression(s) / ED Diagnoses Final diagnoses:  Dizziness  Vertigo    Rx / DC Orders ED Discharge Orders          Ordered    meclizine (ANTIVERT) 25 MG tablet  3 times daily PRN        02/15/22 1538    Ambulatory referral to Neurology       Comments: An appointment is requested in approximately: 1 week   02/15/22 1551              Pamala Duffel 02/15/22 1539    [address] PA-C 02/15/22 1551    Derwood Kaplan, MD 02/16/22 [phone]

## 2022-02-15 NOTE — Discharge Instructions (Addendum)
You were seen today for symptoms consistent with vertigo. There were no signs of stroke at today's visit. I have prescribed medication. If you fail to improve I recommend you follow up with neurology. A referral was placed. If you begin to see signs of stroke, please return for emergent evaluation

## 2023-03-07 DIAGNOSIS — M62838 Other muscle spasm: Secondary | ICD-10-CM | POA: Diagnosis not present

## 2023-03-07 DIAGNOSIS — M542 Cervicalgia: Secondary | ICD-10-CM | POA: Diagnosis not present

## 2023-03-07 DIAGNOSIS — M5489 Other dorsalgia: Secondary | ICD-10-CM | POA: Diagnosis not present

## 2023-09-27 ENCOUNTER — Encounter (HOSPITAL_COMMUNITY): Payer: Self-pay

## 2023-09-27 ENCOUNTER — Other Ambulatory Visit: Payer: Self-pay

## 2023-09-27 ENCOUNTER — Emergency Department (HOSPITAL_COMMUNITY): Payer: Commercial Managed Care - HMO

## 2023-09-27 ENCOUNTER — Observation Stay (HOSPITAL_COMMUNITY)
Admission: EM | Admit: 2023-09-27 | Discharge: 2023-09-28 | Disposition: A | Discharge status: Home / Self Care | Payer: Commercial Managed Care - HMO | Attending: Internal Medicine | Admitting: Internal Medicine

## 2023-09-27 DIAGNOSIS — Z20822 Contact with and (suspected) exposure to covid-19: Secondary | ICD-10-CM | POA: Insufficient documentation

## 2023-09-27 DIAGNOSIS — D72829 Elevated white blood cell count, unspecified: Secondary | ICD-10-CM | POA: Diagnosis not present

## 2023-09-27 DIAGNOSIS — A419 Sepsis, unspecified organism: Principal | ICD-10-CM | POA: Diagnosis present

## 2023-09-27 DIAGNOSIS — J189 Pneumonia, unspecified organism: Secondary | ICD-10-CM | POA: Diagnosis not present

## 2023-09-27 DIAGNOSIS — F17210 Nicotine dependence, cigarettes, uncomplicated: Secondary | ICD-10-CM | POA: Insufficient documentation

## 2023-09-27 DIAGNOSIS — R0602 Shortness of breath: Secondary | ICD-10-CM | POA: Diagnosis present

## 2023-09-27 DIAGNOSIS — R0600 Dyspnea, unspecified: Principal | ICD-10-CM

## 2023-09-27 LAB — CBC
HCT: 44.3 % (ref 39.0–52.0)
Hemoglobin: 14.5 g/dL (ref 13.0–17.0)
MCH: 30.2 pg (ref 26.0–34.0)
MCHC: 32.7 g/dL (ref 30.0–36.0)
MCV: 92.3 fL (ref 80.0–100.0)
Platelets: 219 10*3/uL (ref 150–400)
RBC: 4.8 MIL/uL (ref 4.22–5.81)
RDW: 13.4 % (ref 11.5–15.5)
WBC: 12.1 10*3/uL — ABNORMAL HIGH (ref 4.0–10.5)
nRBC: 0 % (ref 0.0–0.2)

## 2023-09-27 LAB — RESP PANEL BY RT-PCR (RSV, FLU A&B, COVID)  RVPGX2
Influenza A by PCR: NEGATIVE
Influenza B by PCR: NEGATIVE
Resp Syncytial Virus by PCR: POSITIVE — AB
SARS Coronavirus 2 by RT PCR: NEGATIVE

## 2023-09-27 LAB — COMPREHENSIVE METABOLIC PANEL
ALT: 16 U/L (ref 0–44)
AST: 23 U/L (ref 15–41)
Albumin: 3.6 g/dL (ref 3.5–5.0)
Alkaline Phosphatase: 51 U/L (ref 38–126)
Anion gap: 10 (ref 5–15)
BUN: 9 mg/dL (ref 8–23)
CO2: 23 mmol/L (ref 22–32)
Calcium: 8.5 mg/dL — ABNORMAL LOW (ref 8.9–10.3)
Chloride: 104 mmol/L (ref 98–111)
Creatinine, Ser: 0.95 mg/dL (ref 0.61–1.24)
GFR, Estimated: >60 mL/min (ref >60)
Glucose, Bld: 94 mg/dL (ref 70–99)
Potassium: 4.2 mmol/L (ref 3.5–5.1)
Sodium: 137 mmol/L (ref 135–145)
Total Bilirubin: 0.8 mg/dL (ref 0.0–1.2)
Total Protein: 6.6 g/dL (ref 6.5–8.1)

## 2023-09-27 LAB — I-STAT CG4 LACTIC ACID, ED: Lactic Acid, Venous: 1 mmol/L (ref 0.5–1.9)

## 2023-09-27 MED ORDER — TRAZODONE HCL 50 MG PO TABS
25.0000 mg | ORAL_TABLET | Freq: Every evening | ORAL | Status: DC | PRN
  Administered 2023-09-27: 25 mg via ORAL
  Filled 2023-09-27: qty 1

## 2023-09-27 MED ORDER — ONDANSETRON HCL 4 MG PO TABS: 4.0000 mg | ORAL_TABLET | Freq: Four times a day (QID) | ORAL | Status: DC | PRN

## 2023-09-27 MED ORDER — ALBUTEROL SULFATE (2.5 MG/3ML) 0.083% IN NEBU: 2.5000 mg | INHALATION_SOLUTION | RESPIRATORY_TRACT | Status: DC | PRN

## 2023-09-27 MED ORDER — SODIUM CHLORIDE 0.9 % IV SOLN
1.0000 g | INTRAVENOUS | Status: DC
  Administered 2023-09-27: 1 g via INTRAVENOUS
  Filled 2023-09-27: qty 10

## 2023-09-27 MED ORDER — ACETAMINOPHEN 325 MG PO TABS
650.0000 mg | ORAL_TABLET | Freq: Once | ORAL | Status: AC
  Administered 2023-09-27: 650 mg via ORAL
  Filled 2023-09-27: qty 2

## 2023-09-27 MED ORDER — ENOXAPARIN SODIUM 40 MG/0.4ML IJ SOSY
40.0000 mg | PREFILLED_SYRINGE | INTRAMUSCULAR | Status: DC
  Administered 2023-09-27: 40 mg via SUBCUTANEOUS
  Filled 2023-09-27: qty 0.4

## 2023-09-27 MED ORDER — ACETAMINOPHEN 650 MG RE SUPP: 650.0000 mg | Freq: Four times a day (QID) | RECTAL | Status: DC | PRN

## 2023-09-27 MED ORDER — SODIUM CHLORIDE 0.9 % IV BOLUS
1000.0000 mL | Freq: Once | INTRAVENOUS | Status: AC
  Administered 2023-09-27: 1000 mL via INTRAVENOUS

## 2023-09-27 MED ORDER — ONDANSETRON HCL 4 MG/2ML IJ SOLN: 4.0000 mg | Freq: Four times a day (QID) | INTRAMUSCULAR | Status: DC | PRN

## 2023-09-27 MED ORDER — SODIUM CHLORIDE 0.9 % IV SOLN
500.0000 mg | INTRAVENOUS | Status: DC
  Administered 2023-09-27: 500 mg via INTRAVENOUS
  Filled 2023-09-27: qty 5

## 2023-09-27 MED ORDER — SODIUM CHLORIDE 0.9 % IV SOLN: INTRAVENOUS | Status: DC

## 2023-09-27 MED ORDER — ACETAMINOPHEN 325 MG PO TABS: 650.0000 mg | ORAL_TABLET | Freq: Four times a day (QID) | ORAL | Status: DC | PRN

## 2023-09-27 NOTE — H&P (Signed)
History and Physical  Jose Taylor ZOX:[id number] DOB: Mar 31, 1961 DOA: 09/27/2023  PCP: Patient, No Pcp Per   Chief Complaint: Cough, fever  HPI: Jose Taylor is a healthy 63 year old male being admitted to the hospital with 4 days of fever, cough, congestion found to have sepsis due to RSV and suspected community-acquired pneumonia.  States that he was healthy until about 4 days ago, when he started having bodyaches, nonproductive cough, severe congestion, and fever.  Was taking over-the-counter medications without relief so came to the ER today for evaluation.  Tested RSV positive.  He was also started on empiric IV antibiotics, and hospitalist contacted for observation admission.  Review of Systems: Please see HPI for pertinent positives and negatives. A complete 10 system review of systems are otherwise negative.  History reviewed. No pertinent past medical history. Past Surgical History:  Procedure Laterality Date   Artificial Hip     ELBOW SURGERY     Social History:  reports that he has been smoking cigarettes. He has never used smokeless tobacco. He reports current alcohol use. He reports current drug use. Drug: Marijuana.  Allergies  Allergen Reactions   Gadolinium Derivatives Nausea And Vomiting   Morphine And Codeine Itching   Penicillins Hives and Itching    Family History  Problem Relation Age of Onset   Stroke Mother    Diabetes Mother    Dementia Father      Prior to Admission medications   Medication Sig Start Date End Date Taking? Authorizing Provider  fluticasone (FLONASE) 50 MCG/ACT nasal spray Place 2 sprays into both nostrils daily. 08/18/19 12/24/20  Belinda Fisher, PA-C    Physical Exam: BP 94/81   Pulse (!) 126   Temp (!) 102.4 F (39.1 C) (Oral)   Resp (!) 24   Ht 5\' 7"  (1.702 m)   Wt 71.7 kg   SpO2 95%   BMI 24.75 kg/m  General:  Alert, oriented, calm, in no acute distress, resting on room air, looks nontoxic but quite  tired. Cardiovascular: RRR, no murmurs or rubs, no peripheral edema  Respiratory: clear to auscultation bilaterally, no wheezes, no crackles  Abdomen: soft, nontender, nondistended, normal bowel tones heard  Skin: dry, no rashes  Musculoskeletal: no joint effusions, normal range of motion  Psychiatric: appropriate affect, normal speech  Neurologic: extraocular muscles intact, clear speech, moving all extremities with intact sensorium         Labs on Admission:  Basic Metabolic Panel: Recent Labs  Lab 09/27/23 1053  NA 137  K 4.2  CL 104  CO2 23  GLUCOSE 94  BUN 9  CREATININE 0.95  CALCIUM 8.5*   Liver Function Tests: Recent Labs  Lab 09/27/23 1053  AST 23  ALT 16  ALKPHOS 51  BILITOT 0.8  PROT 6.6  ALBUMIN 3.6   No results for input(s): "LIPASE", "AMYLASE" in the last 168 hours. No results for input(s): "AMMONIA" in the last 168 hours. CBC: Recent Labs  Lab 09/27/23 1053  WBC 12.1*  HGB 14.5  HCT 44.3  MCV 92.3  PLT 219   Cardiac Enzymes: No results for input(s): "CKTOTAL", "CKMB", "CKMBINDEX", "TROPONINI" in the last 168 hours. BNP (last 3 results) No results for input(s): "BNP" in the last 8760 hours.  ProBNP (last 3 results) No results for input(s): "PROBNP" in the last 8760 hours.  CBG: No results for input(s): "GLUCAP" in the last 168 hours.  Radiological Exams on Admission: DG Chest 2 View Result Date: 09/27/2023 CLINICAL DATA:  Shortness of breath and chest pain EXAM: CHEST - 2 VIEW COMPARISON:  Chest radiograph dated 05/03/2004 FINDINGS: Normal lung volumes. No focal consolidations. No pleural effusion or pneumothorax. The heart size and mediastinal contours are within normal limits. No acute osseous abnormality. IMPRESSION: No active cardiopulmonary disease. Electronically Signed   By: Agustin Cree M.D.   On: 09/27/2023 10:49   Assessment/Plan Jose Taylor is a healthy 63 year old male being admitted to the hospital with 4 days of fever,  cough, congestion found to have sepsis due to RSV and suspected community-acquired pneumonia.   Sepsis-meeting criteria with fever, tachycardia, leukocytosis.  Source is RSV and given severe congestion with cough there is some concern of possible community-acquired pneumonia.  Perhaps not showing up on chest x-ray due to dehydration.  Note normal lactate, and no evidence of endorgan dysfunction. -Observation admission -Supportive care -Droplet precautions -Empiric IV azithromycin and IV Rocephin  Leukocytosis-due to respiratory illness  DVT prophylaxis: Lovenox     Code Status: Full Code  Consults called: None  Admission status: Observation  Time spent: 48 minutes  Bailynn Dyk Sharlette Dense MD [facility] Pager [phone]  If 7PM-7AM, please contact night-coverage [email or web address] Password [email or web address]  09/27/2023, 1:24 PM

## 2023-09-27 NOTE — ED Provider Notes (Signed)
[place] [facility] AT [facility] Provider Note   CSN: [id number] Arrival date & time: 09/27/23  4098     History  Chief Complaint  Patient presents with   Shortness of Breath    Jose Taylor is a 63 y.o. male.  63 year old male presents with 4 days of fever, cough, congestion.  States that he has had increased dyspnea exertion.  Denies any nausea vomiting or diarrhea.  No anginal or CHF type symptoms.  States that he has had sharp pain on the left side of his chest.  Has been medicating with over-the-counter medications without relief       Home Medications Prior to Admission medications   Medication Sig Start Date End Date Taking? Authorizing Provider  meclizine (ANTIVERT) 25 MG tablet Take 1 tablet (25 mg total) by mouth 3 (three) times daily as needed for dizziness. 02/15/22   Darrick Grinder, PA-C  fluticasone (FLONASE) 50 MCG/ACT nasal spray Place 2 sprays into both nostrils daily. 08/18/19 12/24/20  Belinda Fisher, PA-C      Allergies    Gadolinium derivatives, Morphine and codeine, and Penicillins    Review of Systems   Review of Systems  All other systems reviewed and are negative.   Physical Exam Updated Vital Signs BP 94/81   Pulse (!) 126   Temp (!) 102.4 F (39.1 C) (Oral)   Resp (!) 24   Ht 1.702 m (5\' 7" )   Wt 71.7 kg   SpO2 95%   BMI 24.75 kg/m  Physical Exam Vitals and nursing note reviewed.  Constitutional:      General: He is not in acute distress.    Appearance: Normal appearance. He is well-developed. He is not toxic-appearing.  HENT:     Head: Normocephalic and atraumatic.  Eyes:     General: Lids are normal.     Conjunctiva/sclera: Conjunctivae normal.     Pupils: Pupils are equal, round, and reactive to light.  Neck:     Thyroid: No thyroid mass.     Trachea: No tracheal deviation.  Cardiovascular:     Rate and Rhythm: Regular rhythm. Tachycardia present.     Heart sounds: Normal heart sounds. No  murmur heard.    No gallop.  Pulmonary:     Effort: Tachypnea and respiratory distress present.     Breath sounds: No stridor. Examination of the right-lower field reveals decreased breath sounds. Decreased breath sounds present. No wheezing, rhonchi or rales.  Abdominal:     General: There is no distension.     Palpations: Abdomen is soft.     Tenderness: There is no abdominal tenderness. There is no rebound.  Musculoskeletal:        General: No tenderness. Normal range of motion.     Cervical back: Normal range of motion and neck supple.  Skin:    General: Skin is warm and dry.     Findings: No abrasion or rash.  Neurological:     Mental Status: He is alert and oriented to person, place, and time. Mental status is at baseline.     GCS: GCS eye subscore is 4. GCS verbal subscore is 5. GCS motor subscore is 6.     Cranial Nerves: No cranial nerve deficit.     Sensory: No sensory deficit.     Motor: Motor function is intact.  Psychiatric:        Attention and Perception: Attention normal.        Speech: Speech  normal.        Behavior: Behavior normal.     ED Results / Procedures / Treatments   Labs (all labs ordered are listed, but only abnormal results are displayed) Labs Reviewed  RESP PANEL BY RT-PCR (RSV, FLU A&B, COVID)  [id number]  CULTURE, BLOOD (ROUTINE X 2)  CULTURE, BLOOD (ROUTINE X 2)  CBC  COMPREHENSIVE METABOLIC PANEL  URINALYSIS, W/ REFLEX TO CULTURE (INFECTION SUSPECTED)  I-STAT CG4 LACTIC ACID, ED    EKG EKG Interpretation Date/Time:  Thursday September 27 2023 09:32:40 EST Ventricular Rate:  112 PR Interval:  169 QRS Duration:  82 QT Interval:  303 QTC Calculation: 414 R Axis:   62  Text Interpretation: Sinus tachycardia Confirmed by Lorre Nick ([id number]) on 09/27/2023 9:46:52 AM  Radiology No results found.  Procedures Procedures    Medications Ordered in ED Medications  acetaminophen (TYLENOL) tablet 650 mg (has no administration in time  range)  0.9 %  sodium chloride infusion (has no administration in time range)  sodium chloride 0.9 % bolus 1,000 mL (has no administration in time range)    ED Course/ Medical Decision Making/ A&P                                 Medical Decision Making Amount and/or Complexity of Data Reviewed Labs: ordered. Radiology: ordered.  Risk OTC drugs. Prescription drug management.   Patient's EKG shows sinus tachycardia.  Mild elevation of white count noted.  Chest x-ray without acute findings.  Patient febrile here and given Tylenol.  RSV is positive.  Suspect patient still may have underlying pneumonia given patient's high temperature.  Will start empirically on antibiotics.  This can be de-escalated if needed by inpatient team.  Patient requires hospitalization will consult hospitalist        Final Clinical Impression(s) / ED Diagnoses Final diagnoses:  None    Rx / DC Orders ED Discharge Orders     None         Lorre Nick, MD 09/27/23 1222

## 2023-09-27 NOTE — ED Triage Notes (Signed)
Patient said he cannot get his mucus out. It gets caught in his throat. Feels short of breath. Chest pain on the left side.

## 2023-09-28 DIAGNOSIS — A419 Sepsis, unspecified organism: Secondary | ICD-10-CM | POA: Diagnosis not present

## 2023-09-28 DIAGNOSIS — J189 Pneumonia, unspecified organism: Secondary | ICD-10-CM | POA: Diagnosis not present

## 2023-09-28 DIAGNOSIS — R0600 Dyspnea, unspecified: Secondary | ICD-10-CM | POA: Diagnosis not present

## 2023-09-28 LAB — URINALYSIS, W/ REFLEX TO CULTURE (INFECTION SUSPECTED)
Bacteria, UA: NONE SEEN
Bilirubin Urine: NEGATIVE
Glucose, UA: NEGATIVE mg/dL
Hgb urine dipstick: NEGATIVE
Ketones, ur: NEGATIVE mg/dL
Leukocytes,Ua: NEGATIVE
Nitrite: NEGATIVE
Protein, ur: NEGATIVE mg/dL
Specific Gravity, Urine: 1.008 (ref 1.005–1.030)
pH: 6 (ref 5.0–8.0)

## 2023-09-28 LAB — BASIC METABOLIC PANEL
Anion gap: 4 — ABNORMAL LOW (ref 5–15)
BUN: 8 mg/dL (ref 8–23)
CO2: 22 mmol/L (ref 22–32)
Calcium: 7.6 mg/dL — ABNORMAL LOW (ref 8.9–10.3)
Chloride: 109 mmol/L (ref 98–111)
Creatinine, Ser: 0.8 mg/dL (ref 0.61–1.24)
GFR, Estimated: >60 mL/min (ref >60)
Glucose, Bld: 135 mg/dL — ABNORMAL HIGH (ref 70–99)
Potassium: 3.3 mmol/L — ABNORMAL LOW (ref 3.5–5.1)
Sodium: 135 mmol/L (ref 135–145)

## 2023-09-28 LAB — CBC
HCT: 37.4 % — ABNORMAL LOW (ref 39.0–52.0)
Hemoglobin: 12.3 g/dL — ABNORMAL LOW (ref 13.0–17.0)
MCH: 30.6 pg (ref 26.0–34.0)
MCHC: 32.9 g/dL (ref 30.0–36.0)
MCV: 93 fL (ref 80.0–100.0)
Platelets: 199 10*3/uL (ref 150–400)
RBC: 4.02 MIL/uL — ABNORMAL LOW (ref 4.22–5.81)
RDW: 13.4 % (ref 11.5–15.5)
WBC: 6.9 10*3/uL (ref 4.0–10.5)
nRBC: 0 % (ref 0.0–0.2)

## 2023-09-28 LAB — HIV ANTIBODY (ROUTINE TESTING W REFLEX): HIV Screen 4th Generation wRfx: NONREACTIVE

## 2023-09-28 MED ORDER — PREDNISONE 50 MG PO TABS: ORAL_TABLET | ORAL | 0 refills | Status: AC

## 2023-09-28 MED ORDER — ACETAMINOPHEN 325 MG PO TABS: 650.0000 mg | ORAL_TABLET | Freq: Four times a day (QID) | ORAL | 0 refills | Status: AC | PRN

## 2023-09-28 MED ORDER — BENZONATATE 100 MG PO CAPS: 100.0000 mg | ORAL_CAPSULE | Freq: Three times a day (TID) | ORAL | 0 refills | Status: AC | PRN

## 2023-09-28 MED ORDER — DM-GUAIFENESIN ER 30-600 MG PO TB12: 1.0000 | ORAL_TABLET | Freq: Two times a day (BID) | ORAL | 0 refills | Status: AC

## 2023-09-28 MED ORDER — LEVOFLOXACIN 750 MG PO TABS: 750.0000 mg | ORAL_TABLET | Freq: Every day | ORAL | 0 refills | Status: AC

## 2023-09-28 MED ORDER — ALBUTEROL SULFATE HFA 108 (90 BASE) MCG/ACT IN AERS
2.0000 | INHALATION_SPRAY | Freq: Four times a day (QID) | RESPIRATORY_TRACT | 2 refills | Status: AC | PRN
Start: 2023-09-28 — End: ?

## 2023-09-28 MED ORDER — POTASSIUM CHLORIDE CRYS ER 20 MEQ PO TBCR
40.0000 meq | EXTENDED_RELEASE_TABLET | Freq: Once | ORAL | Status: AC
Start: 2023-09-28 — End: 2023-09-28
  Administered 2023-09-28: 40 meq via ORAL
  Filled 2023-09-28: qty 2

## 2023-09-28 NOTE — Hospital Course (Addendum)
Taken from H&P.  Jose Taylor is a healthy 63 year old male being admitted to the hospital with 4 days of fever, cough, congestion found to have sepsis due to RSV and suspected community-acquired pneumonia.   Patient was febrile at 102.4 on presentation.  No hypoxia and remained on room air, labs with leukocytosis at 12.1.  Chest x-ray without any infiltrate.  1/31: Afebrile with stable vitals, labs with mild hypokalemia at 3.3 which is being repleted.  Leukocytosis resolved.  Patient remained on room air. Continue to have significant cough and congestion. He received ceftriaxone and Zithromax while in the hospital and is being discharged on 5 days of Levaquin.  He was also given 3 days of prednisone along with supportive care.  Patient will continue on current medications and need to have a close follow-up with his primary care provider for further recommendation.

## 2023-09-28 NOTE — Discharge Summary (Signed)
Physician Discharge Summary   Patient: Jose Taylor MRN: [id number] DOB: [birth year]  Admit date:     09/27/2023  Discharge date: 09/28/23  Discharge Physician: Arnetha Courser   PCP: Patient, No Pcp Per   Recommendations at discharge:  Please obtain CBC and BMP on follow-up Follow-up with primary care provider within a week  Discharge Diagnoses: Principal Problem:   Sepsis due to pneumonia [facility]) Active Problems:   Dyspnea  Resolved Problems:   * No resolved hospital problems. [facility] Course: Taken from H&P.  Jose Taylor is a healthy 63 year old male being admitted to the hospital with 4 days of fever, cough, congestion found to have sepsis due to RSV and suspected community-acquired pneumonia.   Patient was febrile at 102.4 on presentation.  No hypoxia and remained on room air, labs with leukocytosis at 12.1.  Chest x-ray without any infiltrate.  1/31: Afebrile with stable vitals, labs with mild hypokalemia at 3.3 which is being repleted.  Leukocytosis resolved.  Patient remained on room air. Continue to have significant cough and congestion. He received ceftriaxone and Zithromax while in the hospital and is being discharged on 5 days of Levaquin.  He was also given 3 days of prednisone along with supportive care.  Patient will continue on current medications and need to have a close follow-up with his primary care provider for further recommendation.   Consultants: None Procedures performed: None Disposition: Home Diet recommendation:  Discharge Diet Orders (From admission, onward)     Start     Ordered   09/28/23 0000  Diet - low sodium heart healthy        09/28/23 1036           Regular diet DISCHARGE MEDICATION: Allergies as of 09/28/2023       Reactions   Gadolinium Derivatives Nausea And Vomiting   Morphine And Codeine Itching   Penicillins Hives, Itching        Medication List     TAKE these medications    acetaminophen 325  MG tablet Commonly known as: TYLENOL Take 2 tablets (650 mg total) by mouth every 6 (six) hours as needed for mild pain (pain score 1-3), moderate pain (pain score 4-6), fever or headache (or Fever >/= 101).   albuterol 108 (90 Base) MCG/ACT inhaler Commonly known as: VENTOLIN HFA Inhale 2 puffs into the lungs every 6 (six) hours as needed for wheezing or shortness of breath.   benzonatate 100 MG capsule Commonly known as: Tessalon Perles Take 1 capsule (100 mg total) by mouth 3 (three) times daily as needed for cough.   dextromethorphan-guaiFENesin 30-600 MG 12hr tablet Commonly known as: MUCINEX DM Take 1 tablet by mouth 2 (two) times daily. For next few days and then as needed   levofloxacin 750 MG tablet Commonly known as: Levaquin Take 1 tablet (750 mg total) by mouth daily for 5 days.   predniSONE 50 MG tablet Commonly known as: DELTASONE Use 1 tablet daily for next 3 days        Discharge Exam: Filed Weights   09/27/23 0928  Weight: 71.7 kg   General.  Well-developed gentleman, in no acute distress. Pulmonary.  Lungs clear bilaterally, normal respiratory effort. CV.  Regular rate and rhythm, no JVD, rub or murmur. Abdomen.  Soft, nontender, nondistended, BS positive. CNS.  Alert and oriented .  No focal neurologic deficit. Extremities.  No edema, no cyanosis, pulses intact and symmetrical. Psychiatry.  Judgment and insight appears normal.   Condition at  discharge: stable  The results of significant diagnostics from this hospitalization (including imaging, microbiology, ancillary and laboratory) are listed below for reference.   Imaging Studies: DG Chest 2 View Result Date: 09/27/2023 CLINICAL DATA:  Shortness of breath and chest pain EXAM: CHEST - 2 VIEW COMPARISON:  Chest radiograph dated 05/03/2004 FINDINGS: Normal lung volumes. No focal consolidations. No pleural effusion or pneumothorax. The heart size and mediastinal contours are within normal limits. No  acute osseous abnormality. IMPRESSION: No active cardiopulmonary disease. Electronically Signed   By: Agustin Cree M.D.   On: 09/27/2023 10:49    Microbiology: Results for orders placed or performed during the hospital encounter of 09/27/23  Resp panel by RT-PCR (RSV, Flu A&B, Covid) Anterior Nasal Swab     Status: Abnormal   Collection Time: 09/27/23  9:39 AM   Specimen: Anterior Nasal Swab  Result Value Ref Range Status   SARS Coronavirus 2 by RT PCR NEGATIVE NEGATIVE Final    Comment: (NOTE) SARS-CoV-2 target nucleic acids are NOT DETECTED.  The SARS-CoV-2 RNA is generally detectable in upper respiratory specimens during the acute phase of infection. The lowest concentration of SARS-CoV-2 viral copies this assay can detect is 138 copies/mL. A negative result does not preclude SARS-Cov-2 infection and should not be used as the sole basis for treatment or other patient management decisions. A negative result may occur with  improper specimen collection/handling, submission of specimen other than nasopharyngeal swab, presence of viral mutation(s) within the areas targeted by this assay, and inadequate number of viral copies(<138 copies/mL). A negative result must be combined with clinical observations, patient history, and epidemiological information. The expected result is Negative.  Fact Sheet for Patients:  BloggerCourse.com  Fact Sheet for Healthcare Providers:  SeriousBroker.it  This test is no t yet approved or cleared by the [place] FDA and  has been authorized for detection and/or diagnosis of SARS-CoV-2 by FDA under an Emergency Use Authorization (EUA). This EUA will remain  in effect (meaning this test can be used) for the duration of the COVID-19 declaration under Section 564(b)(1) of the Act, 21 U.S.C.section 360bbb-3(b)(1), unless the authorization is terminated  or revoked sooner.       Influenza A by PCR  NEGATIVE NEGATIVE Final   Influenza B by PCR NEGATIVE NEGATIVE Final    Comment: (NOTE) The Xpert Xpress SARS-CoV-2/FLU/RSV plus assay is intended as an aid in the diagnosis of influenza from Nasopharyngeal swab specimens and should not be used as a sole basis for treatment. Nasal washings and aspirates are unacceptable for Xpert Xpress SARS-CoV-2/FLU/RSV testing.  Fact Sheet for Patients: BloggerCourse.com  Fact Sheet for Healthcare Providers: SeriousBroker.it  This test is not yet approved or cleared by the [place] FDA and has been authorized for detection and/or diagnosis of SARS-CoV-2 by FDA under an Emergency Use Authorization (EUA). This EUA will remain in effect (meaning this test can be used) for the duration of the COVID-19 declaration under Section 564(b)(1) of the Act, 21 U.S.C. section 360bbb-3(b)(1), unless the authorization is terminated or revoked.     Resp Syncytial Virus by PCR POSITIVE (A) NEGATIVE Final    Comment: (NOTE) Fact Sheet for Patients: BloggerCourse.com  Fact Sheet for Healthcare Providers: SeriousBroker.it  This test is not yet approved or cleared by the [place] FDA and has been authorized for detection and/or diagnosis of SARS-CoV-2 by FDA under an Emergency Use Authorization (EUA). This EUA will remain in effect (meaning this test can be used) for the duration  of the COVID-19 declaration under Section 564(b)(1) of the Act, 21 U.S.C. section 360bbb-3(b)(1), unless the authorization is terminated or revoked.  Performed at [facility], [address] [place], [place] [address]   Culture, blood (Routine X 2) w Reflex to ID Panel     Status: None (Preliminary result)   Collection Time: 09/27/23 10:53 AM   Specimen: BLOOD  Result Value Ref Range Status   Specimen Description   Final    BLOOD LEFT  ANTECUBITAL Performed at [facility], [address], [place], [place] [address]    Special Requests   Final    BOTTLES DRAWN AEROBIC ONLY Blood Culture results may not be optimal due to an inadequate volume of blood received in culture bottles Performed at  [facility], [address] [place], [place] [address]    Culture   Final    NO GROWTH < 24 HOURS Performed at [facility], [address], [place], [place] [address]    Report Status PENDING  Incomplete  Culture, blood (Routine X 2) w Reflex to ID Panel     Status: None (Preliminary result)   Collection Time: 09/27/23 10:53 AM   Specimen: BLOOD  Result Value Ref Range Status   Specimen Description   Final    BLOOD RIGHT ANTECUBITAL Performed at [facility], [address] [place], [place] [address]    Special Requests   Final    BOTTLES DRAWN AEROBIC AND ANAEROBIC Blood Culture adequate volume Performed at [facility], [address], [place], [place] [address]    Culture   Final    NO GROWTH < 24 HOURS Performed at [facility], [address] [place], [place] [address]    Report Status PENDING  Incomplete    Labs: CBC: Recent Labs  Lab 09/27/23 1053 09/28/23 0327  WBC 12.1* 6.9  HGB 14.5 12.3*  HCT 44.3 37.4*  MCV 92.3 93.0  PLT 219 199   Basic Metabolic Panel: Recent Labs  Lab 09/27/23 1053 09/28/23 0327  NA 137 135  K 4.2 3.3*  CL 104 109  CO2 23 22  GLUCOSE 94 135*  BUN 9 8  CREATININE 0.95 0.80  CALCIUM 8.5* 7.6*   Liver Function Tests: Recent Labs  Lab 09/27/23 1053  AST 23  ALT 16  ALKPHOS 51  BILITOT 0.8  PROT 6.6  ALBUMIN 3.6   CBG: No results for input(s): "GLUCAP" in the last 168 hours.  Discharge time spent: greater than 30 minutes.  This record has been created using Conservation officer, historic buildings. Errors have been sought and corrected,but may not always be located. Such creation  errors do not reflect on the standard of care.   Signed: Arnetha Courser, MD [facility] 09/28/2023

## 2023-10-02 LAB — CULTURE, BLOOD (ROUTINE X 2)
Culture: NO GROWTH
Culture: NO GROWTH
Special Requests: ADEQUATE

## 2024-08-21 ENCOUNTER — Other Ambulatory Visit: Payer: Self-pay

## 2024-08-21 ENCOUNTER — Emergency Department (HOSPITAL_COMMUNITY)
Admission: EM | Admit: 2024-08-21 | Discharge: 2024-08-21 | Disposition: A | Discharge status: Home / Self Care | Attending: Emergency Medicine | Admitting: Emergency Medicine

## 2024-08-21 ENCOUNTER — Encounter (HOSPITAL_COMMUNITY): Payer: Self-pay

## 2024-08-21 DIAGNOSIS — L0291 Cutaneous abscess, unspecified: Secondary | ICD-10-CM

## 2024-08-21 DIAGNOSIS — L02415 Cutaneous abscess of right lower limb: Secondary | ICD-10-CM | POA: Insufficient documentation

## 2024-08-21 DIAGNOSIS — Z79899 Other long term (current) drug therapy: Secondary | ICD-10-CM | POA: Diagnosis not present

## 2024-08-21 MED ORDER — DOXYCYCLINE HYCLATE 100 MG PO TABS
100.0000 mg | ORAL_TABLET | Freq: Once | ORAL | Status: AC
  Administered 2024-08-21: 100 mg via ORAL
  Filled 2024-08-21: qty 1

## 2024-08-21 MED ORDER — DOXYCYCLINE HYCLATE 100 MG PO CAPS: 100.0000 mg | ORAL_CAPSULE | Freq: Two times a day (BID) | ORAL | 0 refills | Status: AC

## 2024-08-21 MED ORDER — LIDOCAINE-EPINEPHRINE (PF) 2 %-1:200000 IJ SOLN
10.0000 mL | Freq: Once | INTRAMUSCULAR | Status: AC
  Administered 2024-08-21: 10 mL
  Filled 2024-08-21: qty 20

## 2024-08-21 NOTE — Discharge Instructions (Addendum)
 You were seen today for abscess to right groin area.  It was drained and cleaned.  I am giving you your first dose of antibiotics today, please go to the pharmacy and pick up your doses and continue to take until completion.  He can take Tylenol  and ibuprofen  for pain relief.  Take Tylenol  (acetominophen)  650mg  every 4-6 hours, as needed for pain or fever. Do not take more than 4,000 mg in a 24-hour period. As this may cause liver damage. While this is rare, if you begin to develop yellowing of the skin or eyes, stop taking and return to ER immediately.  Take Ibuprofen  400mg  every 4-6 hours for pain or fever, not exceeding 3,200 mg per day as more than 3,200mg  can cause Stomach irritation, dizziness, kidney issues with long-term use.  Take antibiotics with food to help avoid stomach upset.  Return to the ER for new or worsening symptoms which include fever, uncontrollable pain, spreading redness, swelling despite antibiotic treatment.  Antibiotics will take approximately 3 to 4 days to start noticing results.  Continue to place warm compresses over the area.

## 2024-08-21 NOTE — ED Provider Notes (Signed)
 " [place] EMERGENCY DEPARTMENT AT [facility] Provider Note   CSN: [id number] Arrival date & time: 08/21/24  1136     Patient presents with: Abscess   Jose Taylor is a 63 y.o. male.   Abscess Patient is a 63 year old male presenting ED today for concerns for abscess to right medial thigh that is present x 3 days, painful, tender.  Denies fever, headache, body aches, chills, nausea, vomiting, dysuria, hematuria, discharge, testicular pain, penile pain.  Previous medical history of abscesses.  States this feels similar to abscess.     Prior to Admission medications  Medication Sig Start Date End Date Taking? Authorizing Provider  doxycycline  (VIBRAMYCIN ) 100 MG capsule Take 1 capsule (100 mg total) by mouth 2 (two) times daily for 7 days. 08/21/24 08/28/24 Yes Shadai Mcclane S, PA-C  acetaminophen  (TYLENOL ) 325 MG tablet Take 2 tablets (650 mg total) by mouth every 6 (six) hours as needed for mild pain (pain score 1-3), moderate pain (pain score 4-6), fever or headache (or Fever >/= 101). 09/28/23   Caleen Qualia, MD  albuterol  (VENTOLIN  HFA) 108 (90 Base) MCG/ACT inhaler Inhale 2 puffs into the lungs every 6 (six) hours as needed for wheezing or shortness of breath. 09/28/23   Amin, Sumayya, MD  benzonatate  (TESSALON  PERLES) 100 MG capsule Take 1 capsule (100 mg total) by mouth 3 (three) times daily as needed for cough. 09/28/23 09/27/24  Amin, Sumayya, MD  dextromethorphan-guaiFENesin  (MUCINEX  DM) 30-600 MG 12hr tablet Take 1 tablet by mouth 2 (two) times daily. For next few days and then as needed 09/28/23   Amin, Sumayya, MD  predniSONE  (DELTASONE ) 50 MG tablet Use 1 tablet daily for next 3 days 09/28/23   Caleen Qualia, MD  fluticasone  (FLONASE ) 50 MCG/ACT nasal spray Place 2 sprays into both nostrils daily. 08/18/19 12/24/20  Babara Greig GAILS, PA-C    Allergies: Gadolinium derivatives, Morphine  and codeine, and Penicillins    Review of Systems  Skin:        Abscess  All  other systems reviewed and are negative.   Updated Vital Signs BP 129/82   Pulse 69   Temp 97.9 F (36.6 C) (Oral)   Resp 18   Ht 5' 7 (1.702 m)   Wt 72.6 kg   SpO2 100%   BMI 25.06 kg/m   Physical Exam Vitals and nursing note reviewed.  Constitutional:      General: He is not in acute distress.    Appearance: Normal appearance. He is not ill-appearing or diaphoretic.  HENT:     Head: Normocephalic and atraumatic.  Eyes:     General: No scleral icterus.       Right eye: No discharge.        Left eye: No discharge.     Extraocular Movements: Extraocular movements intact.     Conjunctiva/sclera: Conjunctivae normal.  Cardiovascular:     Rate and Rhythm: Normal rate and regular rhythm.     Pulses: Normal pulses.     Heart sounds: Normal heart sounds. No murmur heard.    No friction rub. No gallop.  Pulmonary:     Effort: Pulmonary effort is normal. No respiratory distress.     Breath sounds: No stridor. No wheezing, rhonchi or rales.  Chest:     Chest wall: No tenderness.  Abdominal:     General: Abdomen is flat. There is no distension.     Palpations: Abdomen is soft.     Tenderness: There is no abdominal  tenderness. There is no right CVA tenderness, left CVA tenderness, guarding or rebound.  Musculoskeletal:        General: No swelling, deformity or signs of injury.     Cervical back: Normal range of motion. No rigidity.     Right lower leg: No edema.     Left lower leg: No edema.  Skin:    General: Skin is warm and dry.     Findings: Lesion (Right medial thigh noted to have approximately 3.5 x 3.5 area of induration with a 2 cm area of fluctuance.) present. No bruising.  Neurological:     General: No focal deficit present.     Mental Status: He is alert and oriented to person, place, and time. Mental status is at baseline.     Sensory: No sensory deficit.     Motor: No weakness.  Psychiatric:        Mood and Affect: Mood normal.     (all labs ordered are  listed, but only abnormal results are displayed) Labs Reviewed - No data to display  EKG: None  Radiology: No results found.  SABRAUltrasound ED Soft Tissue  Date/Time: 08/21/2024 1:34 PM  Performed by: Beola Terrall RAMAN, PA-C Authorized by: Beola Terrall RAMAN, PA-C   Procedure details:    Indications: localization of abscess, evaluate for cellulitis and evaluate for foreign body     Transverse view:  Visualized   Longitudinal view:  Visualized   Images: archived   Location:    Location: lower extremity     Side:  Right Findings:     abscess present    no cellulitis present    no foreign body present .Incision and Drainage  Date/Time: 08/21/2024 1:34 PM  Performed by: Beola Terrall RAMAN, PA-C Authorized by: Beola Terrall RAMAN, PA-C   Consent:    Consent obtained:  Verbal   Consent given by:  Patient   Risks, benefits, and alternatives were discussed: yes     Risks discussed:  Bleeding, damage to other organs, incomplete drainage, pain and infection   Alternatives discussed:  No treatment and delayed treatment Location:    Type:  Abscess   Size:  3 x 3 cm   Location:  Lower extremity   Lower extremity location:  Hip   Hip location:  R hip Pre-procedure details:    Skin preparation:  Antiseptic wash, chlorhexidine  and povidone-iodine Anesthesia:    Anesthesia method:  Local infiltration   Local anesthetic:  10 mL lidocaine -EPINEPHrine  2 %-1:200000 Procedure type:    Complexity:  Simple Procedure details:    Ultrasound guidance: yes     Needle aspiration: yes     Needle size:  20 G   Incision types:  Single straight   Wound management:  Probed and deloculated, irrigated with saline and extensive cleaning   Drainage:  Purulent and bloody   Drainage amount:  Copious   Wound treatment:  Wound left open   Packing materials:  None Post-procedure details:    Procedure completion:  Tolerated well, no immediate complications    Medications Ordered in the ED   lidocaine -EPINEPHrine  (XYLOCAINE  W/EPI) 2 %-1:200000 (PF) injection 10 mL (has no administration in time range)  doxycycline  (VIBRA -TABS) tablet 100 mg (has no administration in time range)    Medical Decision Making Risk Prescription drug management.  This patient is a 63 year old male who presents to the ED for concern of abscess to right groin, with history of abscesses.  No bodyaches, chills, fever.  On physical  exam, patient is in no acute distress, afebrile, alert and orient x 4, speaking in full sentences, nontachypneic, nontachycardic.  Notably has approximate 3 cm area of induration secondary to abscess, was ultrasound which confirmed abscess.  Abscess area was cleaned, drained, deloculated, rinsed out with NS with syringe washing.  Patient tolerated procedure well.  Will send home with doxycycline .  Patient vital signs have remained stable throughout the course of patient's time in the ED. Low suspicion for any other emergent pathology at this time. I believe this patient is safe to be discharged. Provided strict return to ER precautions. Patient expressed agreement and understanding of plan. All questions were answered.  Differential diagnoses prior to evaluation: The emergent differential diagnosis includes, but is not limited to, abscess, cellulitis, Fournier gangrene, necrotizing fasciitis, cyst, lymphadenopathy. This is not an exhaustive differential.   Past Medical History / Co-morbidities / Social History: No reported chronic past medical history.  Additional history: Chart reviewed. Pertinent results include: Last seen emergency department 08/31/2023 for dyspnea, positive for RSV at that time suspected to have underlying pneumonia with temperature and was given empiric antibiotics.  Lab Tests/Imaging studies: I personally interpreted labs/imaging and the pertinent results include:   Point of care ultrasound was done for soft tissue confirming abscess to right groin.     Medications: I ordered medication including doxycycline .  I have reviewed the patients home medicines and have made adjustments as needed.  Critical Interventions: None  Social Determinants of Health: Does not have a PCP as he has been recently without a job for the last Avril months, recently obtained insurance  Disposition: After consideration of the diagnostic results and the patients response to treatment, I feel that the patient would benefit from discharge and shortness above.   emergency department workup does not suggest an emergent condition requiring admission or immediate intervention beyond what has been performed at this time. The plan is: Follow-up with PCP, antibiotics for abscess, return to the ER for new or worsening symptoms. The patient is safe for discharge and has been instructed to return immediately for worsening symptoms, change in symptoms or any other concerns.   Final diagnoses:  Abscess    ED Discharge Orders          Ordered    doxycycline  (VIBRAMYCIN ) 100 MG capsule  2 times daily        08/21/24 1336               Beola Terrall RAMAN, PA-C 08/21/24 1339    Ruthe Cornet, DO 08/21/24 1356  "

## 2024-08-21 NOTE — ED Triage Notes (Signed)
 Pt to er, pt states that he has had an abscess for the past three days on his thigh.  States that he has had some in the past.

## 2024-08-21 NOTE — ED Notes (Signed)
Pt given discharge instructions and verbalized understanding.

## 2024-09-30 ENCOUNTER — Other Ambulatory Visit: Payer: Self-pay

## 2024-09-30 ENCOUNTER — Emergency Department (HOSPITAL_COMMUNITY)
Admission: EM | Admit: 2024-09-30 | Discharge: 2024-10-01 | Disposition: A | Payer: Self-pay | Attending: Emergency Medicine | Admitting: Emergency Medicine

## 2024-09-30 ENCOUNTER — Emergency Department (HOSPITAL_COMMUNITY): Payer: Self-pay

## 2024-09-30 DIAGNOSIS — Z23 Encounter for immunization: Secondary | ICD-10-CM | POA: Insufficient documentation

## 2024-09-30 DIAGNOSIS — W450XXA Nail entering through skin, initial encounter: Secondary | ICD-10-CM | POA: Insufficient documentation

## 2024-09-30 DIAGNOSIS — Y990 Civilian activity done for income or pay: Secondary | ICD-10-CM | POA: Insufficient documentation

## 2024-09-30 DIAGNOSIS — S91331A Puncture wound without foreign body, right foot, initial encounter: Secondary | ICD-10-CM | POA: Insufficient documentation

## 2024-09-30 NOTE — ED Triage Notes (Signed)
 Patient stepped on nail at right foot today . Ambulatory / no bleeding.

## 2024-10-01 MED ORDER — TETANUS-DIPHTH-ACELL PERTUSSIS 5-2-15.5 LF-MCG/0.5 IM SUSP
0.5000 mL | Freq: Once | INTRAMUSCULAR | Status: AC
  Administered 2024-10-01: 0.5 mL via INTRAMUSCULAR
  Filled 2024-10-01: qty 0.5

## 2024-10-01 MED ORDER — CIPROFLOXACIN HCL 500 MG PO TABS: 500.0000 mg | ORAL_TABLET | Freq: Two times a day (BID) | ORAL | 0 refills | Status: AC

## 2024-10-01 NOTE — ED Notes (Signed)
 Reviewed D/C information with the patient, pt verbalized understanding. No additional concerns at this time. Pt provided a sandwich tray and drink

## 2024-10-01 NOTE — ED Notes (Signed)
 Reviewed D/C information with the patient, pt verbalized understanding. No additional concerns at this time.

## 2024-10-01 NOTE — Discharge Instructions (Addendum)
 You were evaluated this morning for a puncture wound of the right foot.  You were given a tetanus booster.  I have prescribed a course of antibiotics to help cover for chance of infection.  Follow-up with podiatry as needed.  You may use Tylenol  and ibuprofen  for pain control.

## 2024-10-01 NOTE — ED Provider Notes (Signed)
 "  EMERGENCY DEPARTMENT AT [facility] Provider Note   CSN: [id number] Arrival date & time: 09/30/24  2320     Patient presents with: R Foot Injury ( Stepped on nail)    Jose Taylor is a 64 y.o. male.  Patient complaining of right foot injury.  Patient states he was at work and stepped on a nail that was sticking out of a piece of wood.  This happened at 2 PM yesterday.  He believes his tetanus vaccination is approximately 62 years old.  He denies other injuries.   HPI     Prior to Admission medications  Medication Sig Start Date End Date Taking? Authorizing Provider  ciprofloxacin  (CIPRO ) 500 MG tablet Take 1 tablet (500 mg total) by mouth every 12 (twelve) hours for 7 days. 10/01/24 10/08/24 Yes Logan Ubaldo NOVAK, PA-C  acetaminophen  (TYLENOL ) 325 MG tablet Take 2 tablets (650 mg total) by mouth every 6 (six) hours as needed for mild pain (pain score 1-3), moderate pain (pain score 4-6), fever or headache (or Fever >/= 101). 09/28/23   Caleen Qualia, MD  albuterol  (VENTOLIN  HFA) 108 (90 Base) MCG/ACT inhaler Inhale 2 puffs into the lungs every 6 (six) hours as needed for wheezing or shortness of breath. 09/28/23   Amin, Sumayya, MD  dextromethorphan-guaiFENesin  (MUCINEX  DM) 30-600 MG 12hr tablet Take 1 tablet by mouth 2 (two) times daily. For next few days and then as needed 09/28/23   Amin, Sumayya, MD  predniSONE  (DELTASONE ) 50 MG tablet Use 1 tablet daily for next 3 days 09/28/23   Caleen Qualia, MD  fluticasone  (FLONASE ) 50 MCG/ACT nasal spray Place 2 sprays into both nostrils daily. 08/18/19 12/24/20  Babara Greig GAILS, PA-C    Allergies: Gadolinium derivatives, Morphine  and codeine, and Penicillins    Review of Systems  Updated Vital Signs BP (!) 140/96 (BP Location: Right Arm)   Pulse 76   Temp 98.2 F (36.8 C) (Oral)   Resp 16   SpO2 100%   Physical Exam Vitals and nursing note reviewed.  HENT:     Head: Normocephalic and atraumatic.  Eyes:     Pupils:  Pupils are equal, round, and reactive to light.  Pulmonary:     Effort: Pulmonary effort is normal. No respiratory distress.  Musculoskeletal:        General: No signs of injury.     Cervical back: Normal range of motion.     Comments: Small puncture wound with no active bleeding noted to the bottom of the right foot.  Mild swelling noted  Skin:    General: Skin is dry.  Neurological:     Mental Status: He is alert.  Psychiatric:        Speech: Speech normal.        Behavior: Behavior normal.     (all labs ordered are listed, but only abnormal results are displayed) Labs Reviewed - No data to display  EKG: None  Radiology: DG Foot Complete Right Result Date: 09/30/2024 CLINICAL DATA:  Injury, stepped on a nail. EXAM: RIGHT FOOT COMPLETE - 3+ VIEW COMPARISON:  None Available. FINDINGS: There is no evidence of fracture or dislocation. Osteoarthritis of the first metatarsal phalangeal joint. No radiopaque foreign body. No soft tissue gas. Soft tissues are unremarkable. IMPRESSION: 1. No fracture or radiopaque foreign body. 2. Osteoarthritis of the first metatarsophalangeal joint. Electronically Signed   By: Andrea Gasman M.D.   On: 09/30/2024 23:57     Procedures   Medications  Ordered in the ED  Tdap (ADACEL ) injection 0.5 mL (has no administration in time range)                                    Medical Decision Making Amount and/or Complexity of Data Reviewed Radiology: ordered.   This patient presents to the ED for concern of a puncture wound   Co morbidities / Chronic conditions that complicate the patient evaluation  History of dyspnea   Additional history obtained:  Additional history obtained from EMR   Imaging Studies ordered:  I ordered imaging studies including plain films of the right foot I independently visualized and interpreted imaging which showed no fracture or radiopaque body I agree with the radiologist interpretation    Problem List  / ED Course / Critical interventions / Medication management   I ordered medication including Tdap booster  Social Determinants of Health:  Patient is a daily smoker   Test / Admission - Considered:  Patient with evidence of a puncture wound due to a nail in the right foot.  No foreign body noted.  Bleeding controlled.  Patient administered tetanus booster.  Prescription for Cipro  to cover for possible Pseudomonas exposure provided.  Patient provided podiatry contact information for follow-up as needed.  Discharged home.      Final diagnoses:  Puncture wound of right foot, initial encounter    ED Discharge Orders          Ordered    ciprofloxacin  (CIPRO ) 500 MG tablet  Every 12 hours        10/01/24 0608               Logan Ubaldo NOVAK, PA-C 10/01/24 9389  "
